# Patient Record
Sex: Female | Born: 1935 | Race: Black or African American | Hispanic: No | Smoking: Never smoker
Health system: Southern US, Community
[De-identification: ages and names within clinical notes are randomized; demographics above are authoritative.]

## PROBLEM LIST (undated history)

## (undated) DIAGNOSIS — I10 Essential (primary) hypertension: Secondary | ICD-10-CM

## (undated) DIAGNOSIS — N289 Disorder of kidney and ureter, unspecified: Secondary | ICD-10-CM

## (undated) DIAGNOSIS — F0390 Unspecified dementia without behavioral disturbance: Secondary | ICD-10-CM

## (undated) DIAGNOSIS — W19XXXA Unspecified fall, initial encounter: Secondary | ICD-10-CM

## (undated) DIAGNOSIS — E119 Type 2 diabetes mellitus without complications: Secondary | ICD-10-CM

---

## 2016-01-20 ENCOUNTER — Emergency Department (HOSPITAL_COMMUNITY): Payer: Medicare Other

## 2016-01-20 ENCOUNTER — Emergency Department (HOSPITAL_COMMUNITY)
Admission: EM | Admit: 2016-01-20 | Discharge: 2016-01-20 | Disposition: A | Discharge status: Home / Self Care | Payer: Medicare Other | Attending: Emergency Medicine | Admitting: Emergency Medicine

## 2016-01-20 ENCOUNTER — Encounter (HOSPITAL_COMMUNITY): Payer: Self-pay | Admitting: Emergency Medicine

## 2016-01-20 DIAGNOSIS — Y92129 Unspecified place in nursing home as the place of occurrence of the external cause: Secondary | ICD-10-CM | POA: Diagnosis not present

## 2016-01-20 DIAGNOSIS — Y999 Unspecified external cause status: Secondary | ICD-10-CM | POA: Insufficient documentation

## 2016-01-20 DIAGNOSIS — W1830XA Fall on same level, unspecified, initial encounter: Secondary | ICD-10-CM | POA: Diagnosis not present

## 2016-01-20 DIAGNOSIS — F0390 Unspecified dementia without behavioral disturbance: Secondary | ICD-10-CM | POA: Insufficient documentation

## 2016-01-20 DIAGNOSIS — W19XXXA Unspecified fall, initial encounter: Secondary | ICD-10-CM

## 2016-01-20 DIAGNOSIS — Y939 Activity, unspecified: Secondary | ICD-10-CM | POA: Diagnosis not present

## 2016-01-20 NOTE — ED Provider Notes (Signed)
[facility]-EMERGENCY DEPT Provider Note   CSN: [id number] Arrival date & time: 01/20/16  1427     History   Chief Complaint Chief Complaint  Patient presents with  . Fall    HPI Cathy Jennings is a 82 y.o. female.  HPI Patient presents to the emergency department with a fall from the nursing home.  The patient was in the activities room and the staff found her sitting at the base of her wheelchair staff did not witness the fall.  The patient was conscious and in her normal state when they found her.  The patient does have dementia and is unable to give much of a history about the fall No past medical history on file.  There are no active problems to display for this patient.   No past surgical history on file.  OB History    No data available       Home Medications    Prior to Admission medications   Medication Sig Start Date End Date Taking? Authorizing Provider  acetaminophen (TYLENOL) 325 MG tablet Take 650 mg by mouth 3 (three) times daily.   Yes Historical Provider, MD  alendronate (FOSAMAX) 70 MG tablet Take 70 mg by mouth once a week. Take with a full glass of water on an empty stomach.   Yes Historical Provider, MD  atorvastatin (LIPITOR) 10 MG tablet Take 10 mg by mouth at bedtime.   Yes Historical Provider, MD  Calcium Carbonate-Vitamin D3 (CALCIUM 600+D3) 600-400 MG-UNIT TABS Take 1 tablet by mouth 2 (two) times daily.   Yes Historical Provider, MD  diclofenac sodium (VOLTAREN) 1 % GEL Apply 4 g topically every morning. APPLY TO BOTH KNEES   Yes Historical Provider, MD  divalproex (DEPAKOTE SPRINKLE) 125 MG capsule Take 250 mg by mouth every 8 (eight) hours.   Yes Historical Provider, MD  donepezil (ARICEPT) 10 MG tablet Take 10 mg by mouth at bedtime.   Yes Historical Provider, MD  lactulose, encephalopathy, (GENERLAC) 10 GM/15ML SOLN Take 20 g by mouth daily as needed (CONSTIPATION).   Yes Historical Provider, MD  levothyroxine (SYNTHROID, LEVOTHROID) 88 MCG tablet  Take 88 mcg by mouth daily before breakfast.   Yes Historical Provider, MD  Melatonin 1 MG TABS Take 2 mg by mouth at bedtime.   Yes Historical Provider, MD  OLANZapine (ZYPREXA) 5 MG tablet Take 5 mg by mouth at bedtime.   Yes Historical Provider, MD  senna-docusate (SENOKOT-S) 8.6-50 MG tablet Take 2 tablets by mouth 2 (two) times daily.   Yes Historical Provider, MD  traZODone (DESYREL) 50 MG tablet Take 25 mg by mouth at bedtime.   Yes Historical Provider, MD  vitamin C (ASCORBIC ACID) 500 MG tablet Take 500 mg by mouth every morning.   Yes Historical Provider, MD    Family History No family history on file.  Social History Social History  Substance Use Topics  . Smoking status: Never Smoker  . Smokeless tobacco: Never Used  . Alcohol use Not on file     Allergies   Review of patient's allergies indicates no known allergies.   Review of Systems Review of Systems Level V caveat applies due to dementia  Physical Exam Updated Vital Signs BP 144/59   Pulse (!) 56   Temp 98.7 F (37.1 C)   Resp 13   SpO2 96%   Physical Exam  Constitutional: She is oriented to person, place, and time. She appears well-developed and well-nourished. No distress.  HENT:  Head: Normocephalic  and atraumatic.  Mouth/Throat: Oropharynx is clear and moist.  Eyes: Pupils are equal, round, and reactive to light.  Neck: Normal range of motion. Neck supple.  Cardiovascular: Normal rate, regular rhythm and normal heart sounds.  Exam reveals no gallop and no friction rub.   No murmur heard. Pulmonary/Chest: Effort normal and breath sounds normal. No respiratory distress. She has no wheezes.  Abdominal: Soft. Bowel sounds are normal. She exhibits no distension. There is no tenderness.  Musculoskeletal:       Right hip: Normal.       Left hip: Normal.       Cervical back: Normal.       Thoracic back: Normal.       Lumbar back: Normal.  Neurological: She is alert and oriented to person, place, and  time. She exhibits normal muscle tone. Coordination normal.  Skin: Skin is warm and dry. No rash noted. No erythema.  Psychiatric: She has a normal mood and affect. Her behavior is normal.  Nursing note and vitals reviewed.    ED Treatments / Results  Labs (all labs ordered are listed, but only abnormal results are displayed) Labs Reviewed - No data to display  EKG  EKG Interpretation None       Radiology Dg Lumbar Spine Complete  Result Date: 01/20/2016 CLINICAL DATA:  Unwitnessed fall. EXAM: LUMBAR SPINE - COMPLETE 4+ VIEW COMPARISON:  None. FINDINGS: There is no evidence of lumbar spine fracture. There are extensive osteoarthritic changes throughout the lumbosacral spine, particularly worse at L3-L4, L4-L5 and L5-S1, with disc space narrowing, endplate sclerosis, remodeling of vertebral bodies and osteophyte formation. Posterior facet arthropathy, moderate in severity is also seen in the lower lumbosacral spine. No alignment abnormalities are seen. IMPRESSION: No acute fracture or dislocation identified about the lumbosacral spine. Moderate to advanced osteoarthritic changes, particularly prominent in the lower lumbosacral spine. Electronically Signed   By: Ted Mcalpine M.D.   On: 01/20/2016 16:36   Ct Head Wo Contrast  Result Date: 01/20/2016 CLINICAL DATA:  Unwitnessed fall today at nursing home. EXAM: CT HEAD WITHOUT CONTRAST TECHNIQUE: Contiguous axial images were obtained from the base of the skull through the vertex without intravenous contrast. COMPARISON:  11/22/2014 FINDINGS: Brain: Stable age related cerebral atrophy, ventriculomegaly and periventricular white matter disease. No extra-axial fluid collections are identified. No CT findings for acute hemispheric infarction or intracranial hemorrhage. No mass lesions. The brainstem and cerebellum are normal. Vascular: Stable vascular calcifications. No definite aneurysm hyperdense vessels. Skull: No skull fracture or bone  lesion. Stable dural calcifications. Sinuses/Orbits: The paranasal sinuses and mastoid air cells are clear. The globes are intact. Other: No scalp lesions or hematoma. IMPRESSION: Stable age related cerebral atrophy, ventriculomegaly and periventricular white matter disease. No acute intracranial findings or skull fracture. Electronically Signed   By: Rudie Meyer M.D.   On: 01/20/2016 16:57   Dg Hip Unilat With Pelvis 2-3 Views Left  Result Date: 01/20/2016 CLINICAL DATA:  Fall. EXAM: DG HIP (WITH OR WITHOUT PELVIS) 2-3V LEFT COMPARISON:  No recent prior. FINDINGS: Degenerative changes lumbar spine, both SI joints, and both hips. No evidence of fracture or dislocation. Metallic densities are noted over the pelvis. Pelvic calcifications consistent phleboliths. IMPRESSION: No acute abnormality. Degenerative changes lumbar spine, both SI joints, and both hips. Electronically Signed   By: Maisie Fus  Register   On: 01/20/2016 16:35    Procedures Procedures (including critical care time)  Medications Ordered in ED Medications - No data to display  Initial Impression / Assessment and Plan / ED Course  I have reviewed the triage vital signs and the nursing notes.  Pertinent labs & imaging results that were available during my care of the patient were reviewed by me and considered in my medical decision making (see chart for details).  Clinical Course    Patient has no CT scan or lane film abnormalities.  The patient will be advised to follow-up with her primary care Dr. told to return here as needed  Final Clinical Impressions(s) / ED Diagnoses   Final diagnoses:  Fall    New Prescriptions New Prescriptions   No medications on file     Charlestine Night, PA-C 01/20/16 1759    Pricilla Loveless, MD 01/22/16 1001

## 2016-01-20 NOTE — ED Notes (Signed)
Attempted to call report to [facility]  

## 2016-01-20 NOTE — ED Triage Notes (Signed)
Per EMS, [facility], pt had unwitnessed fall in the activity room. Staff suspects patient slid out of her wheelchair. Pt is wheelchair bound with hx dementia. A&Ox1. Pt c/o left knee pain. Hx chronic knee pain

## 2016-01-20 NOTE — ED Notes (Signed)
Bed: [facility]ALE Expected date:  Expected time:  Means of arrival:  Comments: 

## 2016-01-20 NOTE — Discharge Instructions (Signed)
Follow-up with her primary care doctor.  Return here as needed °

## 2016-01-20 NOTE — ED Notes (Signed)
Bed: WA21 Expected date:  Expected time:  Means of arrival:  Comments: EMS-82yo F, fall

## 2016-01-20 NOTE — Progress Notes (Signed)
[facility].  Patient listed as having Medicaid insurance without a pcp.  Per nursing facility transfer paperwork, patient's pcp is Dr. Ron ParkerSamuel Bowen.  System updated.

## 2016-01-20 NOTE — ED Notes (Signed)
[facility] called for transport.  

## 2016-02-26 ENCOUNTER — Emergency Department (HOSPITAL_COMMUNITY): Payer: Medicare Other

## 2016-02-26 ENCOUNTER — Emergency Department (HOSPITAL_COMMUNITY)
Admission: EM | Admit: 2016-02-26 | Discharge: 2016-02-26 | Disposition: A | Discharge status: Home / Self Care | Payer: Medicare Other | Attending: Emergency Medicine | Admitting: Emergency Medicine

## 2016-02-26 ENCOUNTER — Encounter (HOSPITAL_COMMUNITY): Payer: Self-pay | Admitting: Emergency Medicine

## 2016-02-26 DIAGNOSIS — Y999 Unspecified external cause status: Secondary | ICD-10-CM | POA: Diagnosis not present

## 2016-02-26 DIAGNOSIS — Y929 Unspecified place or not applicable: Secondary | ICD-10-CM | POA: Diagnosis not present

## 2016-02-26 DIAGNOSIS — E119 Type 2 diabetes mellitus without complications: Secondary | ICD-10-CM | POA: Insufficient documentation

## 2016-02-26 DIAGNOSIS — Y939 Activity, unspecified: Secondary | ICD-10-CM | POA: Insufficient documentation

## 2016-02-26 DIAGNOSIS — I10 Essential (primary) hypertension: Secondary | ICD-10-CM | POA: Diagnosis not present

## 2016-02-26 DIAGNOSIS — S0990XA Unspecified injury of head, initial encounter: Secondary | ICD-10-CM | POA: Insufficient documentation

## 2016-02-26 DIAGNOSIS — F0390 Unspecified dementia without behavioral disturbance: Secondary | ICD-10-CM | POA: Diagnosis not present

## 2016-02-26 DIAGNOSIS — W19XXXA Unspecified fall, initial encounter: Secondary | ICD-10-CM

## 2016-02-26 HISTORY — DX: Disorder of kidney and ureter, unspecified: N28.9

## 2016-02-26 HISTORY — DX: Unspecified fall, initial encounter: W19.XXXA

## 2016-02-26 HISTORY — DX: Essential (primary) hypertension: I10

## 2016-02-26 HISTORY — DX: Unspecified dementia, unspecified severity, without behavioral disturbance, psychotic disturbance, mood disturbance, and anxiety: F03.90

## 2016-02-26 HISTORY — DX: Type 2 diabetes mellitus without complications: E11.9

## 2016-02-26 LAB — CBC WITH DIFFERENTIAL/PLATELET
Basophils Absolute: 0 10*3/uL (ref 0.0–0.1)
Basophils Relative: 0 %
EOS ABS: 0.2 10*3/uL (ref 0.0–0.7)
Eosinophils Relative: 2 %
HEMATOCRIT: 34.3 % — AB (ref 36.0–46.0)
HEMOGLOBIN: 11.1 g/dL — AB (ref 12.0–15.0)
Lymphocytes Relative: 25 %
Lymphs Abs: 2.3 10*3/uL (ref 0.7–4.0)
MCH: 30.5 pg (ref 26.0–34.0)
MCHC: 32.4 g/dL (ref 30.0–36.0)
MCV: 94.2 fL (ref 78.0–100.0)
MONO ABS: 0.9 10*3/uL (ref 0.1–1.0)
Monocytes Relative: 10 %
Neutro Abs: 5.6 10*3/uL (ref 1.7–7.7)
Neutrophils Relative %: 63 %
Platelets: 175 10*3/uL (ref 150–400)
RBC: 3.64 MIL/uL — AB (ref 3.87–5.11)
RDW: 17 % — ABNORMAL HIGH (ref 11.5–15.5)
WBC: 9 10*3/uL (ref 4.0–10.5)

## 2016-02-26 LAB — COMPREHENSIVE METABOLIC PANEL
ALBUMIN: 3.5 g/dL (ref 3.5–5.0)
ALK PHOS: 58 U/L (ref 38–126)
ALT: 17 U/L (ref 14–54)
AST: 32 U/L (ref 15–41)
Anion gap: 7 (ref 5–15)
BILIRUBIN TOTAL: 0.6 mg/dL (ref 0.3–1.2)
BUN: 9 mg/dL (ref 6–20)
CALCIUM: 9.2 mg/dL (ref 8.9–10.3)
CO2: 28 mmol/L (ref 22–32)
CREATININE: 0.78 mg/dL (ref 0.44–1.00)
Chloride: 105 mmol/L (ref 101–111)
GFR calc Af Amer: >60 mL/min (ref >60)
GFR calc non Af Amer: >60 mL/min (ref >60)
GLUCOSE: 85 mg/dL (ref 65–99)
Potassium: 4 mmol/L (ref 3.5–5.1)
Sodium: 140 mmol/L (ref 135–145)
TOTAL PROTEIN: 6.6 g/dL (ref 6.5–8.1)

## 2016-02-26 LAB — URINALYSIS, ROUTINE W REFLEX MICROSCOPIC
Bilirubin Urine: NEGATIVE
GLUCOSE, UA: NEGATIVE mg/dL
HGB URINE DIPSTICK: NEGATIVE
Ketones, ur: NEGATIVE mg/dL
LEUKOCYTES UA: NEGATIVE
Nitrite: NEGATIVE
PROTEIN: NEGATIVE mg/dL
SPECIFIC GRAVITY, URINE: 1.008 (ref 1.005–1.030)
pH: 7.5 (ref 5.0–8.0)

## 2016-02-26 LAB — TROPONIN I

## 2016-02-26 LAB — CBG MONITORING, ED: GLUCOSE-CAPILLARY: 78 mg/dL (ref 65–99)

## 2016-02-26 MED ORDER — SODIUM CHLORIDE 0.9 % IV BOLUS (SEPSIS)
1000.0000 mL | Freq: Once | INTRAVENOUS | Status: AC
  Administered 2016-02-26: 1000 mL via INTRAVENOUS

## 2016-02-26 NOTE — ED Notes (Signed)
Bed: WA03 Expected date:  Expected time:  Means of arrival:  Comments: 82 yo F fall, hypotension

## 2016-02-26 NOTE — Progress Notes (Signed)
CSW attempted to speak with patient at bedside with no one present. Patient was asleep and did not wake up to the call of her name.  Cathy Jennings, LCSWA Clincial Social Worker [phone] 3:14 PM

## 2016-02-26 NOTE — ED Provider Notes (Signed)
[facility]-EMERGENCY DEPT Provider Note   CSN: [id number] Arrival date & time: 02/26/16  1341     History   Chief Complaint Chief Complaint  Patient presents with  . Fall    HPI Cathy Jennings is a 82 y.o. female.  Patient is 82 yo F with PMH of dementia, presenting from [place] after witnessed fall today just PTA. Patient is nonverbal at baseline and normally in wheelchair. Upon speaking directly with staff member at [facility], the patient slumped forward at her table while in the dining area, fell out of her wheelchair and hit her head. No reported LOC or change in baseline mental status. Per policy, she was sent to ED for evaluation. Level V caveat applies secondary to dementia.      Past Medical History:  Diagnosis Date  . Dementia   . Diabetes mellitus without complication (HCC)   . Fall   . Hypertension   . Renal disorder     There are no active problems to display for this patient.   No past surgical history on file.  OB History    No data available       Home Medications    Prior to Admission medications   Medication Sig Start Date End Date Taking? Authorizing Provider  acetaminophen (TYLENOL) 325 MG tablet Take 650 mg by mouth 3 (three) times daily.   Yes Historical Provider, MD  acetaminophen (TYLENOL) 500 MG tablet Take 500 mg by mouth every 4 (four) hours as needed for mild pain, moderate pain, fever or headache.   Yes Historical Provider, MD  alendronate (FOSAMAX) 70 MG tablet Take 70 mg by mouth every Wednesday. Take with a full glass of water on an empty stomach.    Yes Historical Provider, MD  alum & mag hydroxide-simeth (MINTOX) 200-200-20 MG/5ML suspension Take 30 mLs by mouth as needed for indigestion or heartburn.   Yes Historical Provider, MD  atorvastatin (LIPITOR) 10 MG tablet Take 10 mg by mouth at bedtime.   Yes Historical Provider, MD  Calcium Carbonate-Vitamin D3 (CALCIUM 600+D3) 600-400 MG-UNIT TABS Take 1 tablet by mouth 2 (two)  times daily.   Yes Historical Provider, MD  diclofenac sodium (VOLTAREN) 1 % GEL Apply 4 g topically daily after breakfast. APPLY TO BOTH KNEES    Yes Historical Provider, MD  divalproex (DEPAKOTE SPRINKLE) 125 MG capsule Take 250 mg by mouth every 8 (eight) hours.   Yes Historical Provider, MD  gabapentin (NEURONTIN) 100 MG capsule Take 200 mg by mouth 3 (three) times daily.   Yes Historical Provider, MD  guaifenesin (ROBAFEN) 100 MG/5ML syrup Take 200 mg by mouth every 6 (six) hours as needed for cough.   Yes Historical Provider, MD  lactulose, encephalopathy, (GENERLAC) 10 GM/15ML SOLN Take 20 g by mouth daily as needed (CONSTIPATION).   Yes Historical Provider, MD  levothyroxine (SYNTHROID, LEVOTHROID) 88 MCG tablet Take 88 mcg by mouth daily before breakfast.   Yes Historical Provider, MD  loperamide (IMODIUM) 2 MG capsule Take 2 mg by mouth as needed for diarrhea or loose stools.   Yes Historical Provider, MD  LORazepam (ATIVAN) 0.5 MG tablet Take 0.5 mg by mouth 2 (two) times daily.   Yes Historical Provider, MD  magnesium hydroxide (MILK OF MAGNESIA) 400 MG/5ML suspension Take 30 mLs by mouth at bedtime as needed for mild constipation.   Yes Historical Provider, MD  Melatonin 1 MG TABS Take 4 mg by mouth at bedtime.    Yes Historical Provider, MD  neomycin-bacitracin-polymyxin (NEOSPORIN) ointment Apply 1 application topically as needed for wound care.   Yes Historical Provider, MD  OLANZapine (ZYPREXA) 2.5 MG tablet Take 2.5 mg by mouth 2 (two) times daily.   Yes Historical Provider, MD  PRESCRIPTION MEDICATION Apply 0.5 mLs topically 3 (three) times daily as needed (for severe agitation). *R-Lorazepam Gel 1mg /ml*  Apply to skin   Yes Historical Provider, MD  senna-docusate (SENOKOT-S) 8.6-50 MG tablet Take 2 tablets by mouth 2 (two) times daily.   Yes Historical Provider, MD  traZODone (DESYREL) 50 MG tablet Take 50 mg by mouth at bedtime.    Yes Historical Provider, MD  traZODone  (DESYREL) 50 MG tablet Take 50 mg by mouth See admin instructions. Take 50mg  now then 50mg  every 6 hours as needed   Yes Historical Provider, MD  vitamin C (ASCORBIC ACID) 500 MG tablet Take 500 mg by mouth every morning.   Yes Historical Provider, MD    Family History History reviewed. No pertinent family history.  Social History Social History  Substance Use Topics  . Smoking status: Never Smoker  . Smokeless tobacco: Never Used  . Alcohol use No     Allergies   Patient has no known allergies.   Review of Systems Review of Systems  Unable to perform ROS: Dementia     Physical Exam Updated Vital Signs BP 130/59 (BP Location: Left Arm)   Pulse (!) 54   Temp 98.5 F (36.9 C) (Oral)   Resp 18   SpO2 97%   Physical Exam  Constitutional:  Elderly female, nonverbal and snoring in bed, VSS  HENT:  Head: Normocephalic and atraumatic.  Mouth/Throat: Oropharynx is clear and moist.  No Raccoon's eyes, Battle's sign, abrasion, or contusion noted to head. No hemotympanum, external ears normal bilaterally. No nasal deformity.  Eyes: Conjunctivae are normal.  Pinpoint pupils noted.  Neck: Normal range of motion. Neck supple.  Cardiovascular: Normal rate, regular rhythm, normal heart sounds and intact distal pulses.   Pulmonary/Chest: Effort normal and breath sounds normal. No respiratory distress. She has no wheezes. She has no rales.  Abdominal: Soft. Bowel sounds are normal. She exhibits no distension.  Musculoskeletal: Normal range of motion. She exhibits no edema or tenderness.  Bilateral hips with FROM, BLE no crepitus or deformity, no limb length discrepancy or abnormal rotation. Distal pulses intact, compartments soft.  Neurological:  Patient responds to painful stimuli, but nonverbal at baseline. Limited neuro exam performed secondary to advanced dementia. Patient at baseline according to staff at [facility].  Skin: Skin is warm and dry.  Nursing note and vitals  reviewed.    ED Treatments / Results  Labs (all labs ordered are listed, but only abnormal results are displayed) Labs Reviewed  CBC WITH DIFFERENTIAL/PLATELET - Abnormal; Notable for the following:       Result Value   RBC 3.64 (*)    Hemoglobin 11.1 (*)    HCT 34.3 (*)    RDW 17.0 (*)    All other components within normal limits  COMPREHENSIVE METABOLIC PANEL  URINALYSIS, ROUTINE W REFLEX MICROSCOPIC (NOT AT [facility])  TROPONIN I  CBG MONITORING, ED    EKG  EKG Interpretation None       Radiology Dg Chest 2 View  Result Date: 02/26/2016 CLINICAL DATA:  Recent fall EXAM: CHEST  2 VIEW COMPARISON:  None. FINDINGS: Cardiac shadow is at the upper limits of normal in size. The lungs are clear bilaterally. No acute bony abnormality is seen. Degenerative  changes of the shoulder joints is noted right greater than left. IMPRESSION: No acute abnormality noted. Electronically Signed   By: Alcide CleverMark  Lukens M.D.   On: 02/26/2016 16:08   Ct Head Wo Contrast  Result Date: 02/26/2016 CLINICAL DATA:  Larey SeatFell out of wheelchair today EXAM: CT HEAD WITHOUT CONTRAST CT CERVICAL SPINE WITHOUT CONTRAST TECHNIQUE: Multidetector CT imaging of the head and cervical spine was performed following the standard protocol without intravenous contrast. Multiplanar CT image reconstructions of the cervical spine were also generated. COMPARISON:  01/20/2016 FINDINGS: CT HEAD FINDINGS Brain: No intracranial hemorrhage, mass effect or midline shift. There are motion artifacts. Stable atrophy and chronic white matter disease. No definite acute cortical infarction. No mass lesion is noted on this unenhanced scan. Ventricular size is stable from prior exam. Vascular: Atherosclerotic calcifications of carotid siphon Skull: No skull fracture is identified. Sinuses/Orbits: No acute finding. Other: None. CT CERVICAL SPINE FINDINGS Alignment: There is normal alignment. Skull base and vertebrae: No acute fracture or subluxation.  Degenerative changes are noted C1-C2 articulation. Multilevel moderate anterior spurring is noted. Multilevel mild posterior spurring. Soft tissues and spinal canal: No prevertebral soft tissue swelling. Mild spinal canal stenosis due to posterior spurring at C5-C6-C6-C7 C7-T1 and T1-T2 level. Disc levels: There is disc space flattening at C3-C4 level. Mild disc space flattening at C4-C5 level. Significant disc space flattening with endplate sclerotic changes at C5-C6-C6-C7 and C7-T1 level. Moderate disc space flattening at T1-T2 level. Upper chest: No pneumothorax in visualized lung apices. Atherosclerotic calcifications are noted bilateral carotid bifurcation. Other: None IMPRESSION: 1. No acute intracranial abnormality. Stable atrophy and chronic white matter disease. 2. No cervical spine acute fracture or subluxation. Multilevel degenerative changes as described above. Electronically Signed   By: Natasha MeadLiviu  Pop M.D.   On: 02/26/2016 16:03   Ct Cervical Spine Wo Contrast  Result Date: 02/26/2016 CLINICAL DATA:  Larey SeatFell out of wheelchair today EXAM: CT HEAD WITHOUT CONTRAST CT CERVICAL SPINE WITHOUT CONTRAST TECHNIQUE: Multidetector CT imaging of the head and cervical spine was performed following the standard protocol without intravenous contrast. Multiplanar CT image reconstructions of the cervical spine were also generated. COMPARISON:  01/20/2016 FINDINGS: CT HEAD FINDINGS Brain: No intracranial hemorrhage, mass effect or midline shift. There are motion artifacts. Stable atrophy and chronic white matter disease. No definite acute cortical infarction. No mass lesion is noted on this unenhanced scan. Ventricular size is stable from prior exam. Vascular: Atherosclerotic calcifications of carotid siphon Skull: No skull fracture is identified. Sinuses/Orbits: No acute finding. Other: None. CT CERVICAL SPINE FINDINGS Alignment: There is normal alignment. Skull base and vertebrae: No acute fracture or subluxation.  Degenerative changes are noted C1-C2 articulation. Multilevel moderate anterior spurring is noted. Multilevel mild posterior spurring. Soft tissues and spinal canal: No prevertebral soft tissue swelling. Mild spinal canal stenosis due to posterior spurring at C5-C6-C6-C7 C7-T1 and T1-T2 level. Disc levels: There is disc space flattening at C3-C4 level. Mild disc space flattening at C4-C5 level. Significant disc space flattening with endplate sclerotic changes at C5-C6-C6-C7 and C7-T1 level. Moderate disc space flattening at T1-T2 level. Upper chest: No pneumothorax in visualized lung apices. Atherosclerotic calcifications are noted bilateral carotid bifurcation. Other: None IMPRESSION: 1. No acute intracranial abnormality. Stable atrophy and chronic white matter disease. 2. No cervical spine acute fracture or subluxation. Multilevel degenerative changes as described above. Electronically Signed   By: Natasha MeadLiviu  Pop M.D.   On: 02/26/2016 16:03    Procedures Procedures (including critical care time)  Medications Ordered in  ED Medications  sodium chloride 0.9 % bolus 1,000 mL (1,000 mLs Intravenous New Bag/Given 02/26/16 1622)     Initial Impression / Assessment and Plan / ED Course  I have reviewed the triage vital signs and the nursing notes.  Pertinent labs & imaging results that were available during my care of the patient were reviewed by me and considered in my medical decision making (see chart for details).  Clinical Course    Patient is 82 yo F with PMH of dementia, [place] [facility] [place] [facility] [place] just [place]. On exam, VSS with no evidence of trauma, but patient is nonverbal and therefore neuro exam limited. She responds to painful stimuli, and according to staff is at baseline mental status. CT head and neck show no acute intracranial abnormality or fractures. EKG with no significant changes from prior tracing, and troponin negative. Per discussion with attending Theda Belfasthris Tegeler,  CXR and labs ordered to r/o acute infection. CXR negative. CBC, CMP, and urinalysis unremarkable. Given no acute findings, patient stable for d/c back to facility with return precautions for any new or worsening symptoms or change in mental status.  Final Clinical Impressions(s) / ED Diagnoses   Final diagnoses:  Fall, initial encounter    New Prescriptions New Prescriptions   No medications on file     Jari PiggDaryl F de Villier II, [place] 02/26/16 1800    Heide Scaleshristopher J Tegeler, MD 02/28/16 [phone]

## 2016-02-26 NOTE — Discharge Instructions (Signed)
Cathy Jennings has no evidence of trauma from her fall, and had a negative head and neck CT. There is no acute cardiac cause, or evidence of pneumonia, anemia, electrolyte imbalance, or UTI causing her to fall. Please return to ED for any change from baseline mental status.

## 2016-02-26 NOTE — Progress Notes (Signed)
Pt with [facility] 2 ED visits in last 6 months and both times for falls  No ED CP  No [facility] referral availability  ED CM and ED SW spoke about this pt -info sent to SW asst director

## 2016-02-26 NOTE — ED Triage Notes (Signed)
[facility] of [facility] today. Nonverbal, normally in wheelchair. Larey SeatFell forward out of wheelchair today while in the dining area.

## 2016-03-26 ENCOUNTER — Encounter (HOSPITAL_COMMUNITY): Payer: Self-pay | Admitting: *Deleted

## 2016-03-26 ENCOUNTER — Inpatient Hospital Stay (HOSPITAL_COMMUNITY)
Admission: EM | Admit: 2016-03-26 | Discharge: 2016-03-30 | DRG: 871 | Disposition: A | Discharge status: Hospice | Payer: Medicare Other | Attending: Internal Medicine | Admitting: Internal Medicine

## 2016-03-26 ENCOUNTER — Emergency Department (HOSPITAL_COMMUNITY): Payer: Medicare Other

## 2016-03-26 DIAGNOSIS — E039 Hypothyroidism, unspecified: Secondary | ICD-10-CM | POA: Diagnosis present

## 2016-03-26 DIAGNOSIS — Z66 Do not resuscitate: Secondary | ICD-10-CM | POA: Diagnosis present

## 2016-03-26 DIAGNOSIS — N179 Acute kidney failure, unspecified: Secondary | ICD-10-CM | POA: Diagnosis present

## 2016-03-26 DIAGNOSIS — E119 Type 2 diabetes mellitus without complications: Secondary | ICD-10-CM | POA: Diagnosis not present

## 2016-03-26 DIAGNOSIS — G9341 Metabolic encephalopathy: Secondary | ICD-10-CM | POA: Diagnosis present

## 2016-03-26 DIAGNOSIS — E860 Dehydration: Secondary | ICD-10-CM | POA: Diagnosis present

## 2016-03-26 DIAGNOSIS — F0390 Unspecified dementia without behavioral disturbance: Secondary | ICD-10-CM | POA: Diagnosis present

## 2016-03-26 DIAGNOSIS — N3001 Acute cystitis with hematuria: Secondary | ICD-10-CM | POA: Diagnosis not present

## 2016-03-26 DIAGNOSIS — E870 Hyperosmolality and hypernatremia: Secondary | ICD-10-CM | POA: Diagnosis present

## 2016-03-26 DIAGNOSIS — I4891 Unspecified atrial fibrillation: Secondary | ICD-10-CM | POA: Diagnosis present

## 2016-03-26 DIAGNOSIS — N390 Urinary tract infection, site not specified: Secondary | ICD-10-CM | POA: Diagnosis present

## 2016-03-26 DIAGNOSIS — Z515 Encounter for palliative care: Secondary | ICD-10-CM | POA: Diagnosis present

## 2016-03-26 DIAGNOSIS — B370 Candidal stomatitis: Secondary | ICD-10-CM | POA: Diagnosis present

## 2016-03-26 DIAGNOSIS — E1165 Type 2 diabetes mellitus with hyperglycemia: Secondary | ICD-10-CM | POA: Diagnosis present

## 2016-03-26 DIAGNOSIS — R4182 Altered mental status, unspecified: Secondary | ICD-10-CM | POA: Diagnosis not present

## 2016-03-26 DIAGNOSIS — I10 Essential (primary) hypertension: Secondary | ICD-10-CM | POA: Diagnosis present

## 2016-03-26 DIAGNOSIS — J9601 Acute respiratory failure with hypoxia: Secondary | ICD-10-CM | POA: Diagnosis present

## 2016-03-26 DIAGNOSIS — A419 Sepsis, unspecified organism: Principal | ICD-10-CM | POA: Diagnosis present

## 2016-03-26 DIAGNOSIS — Z7983 Long term (current) use of bisphosphonates: Secondary | ICD-10-CM | POA: Diagnosis not present

## 2016-03-26 LAB — COMPREHENSIVE METABOLIC PANEL
ALBUMIN: 3.2 g/dL — AB (ref 3.5–5.0)
ALT: 39 U/L (ref 14–54)
AST: 55 U/L — AB (ref 15–41)
Alkaline Phosphatase: 66 U/L (ref 38–126)
BILIRUBIN TOTAL: 0.5 mg/dL (ref 0.3–1.2)
BUN: 96 mg/dL — AB (ref 6–20)
CO2: 26 mmol/L (ref 22–32)
CREATININE: 1.69 mg/dL — AB (ref 0.44–1.00)
Calcium: 9.4 mg/dL (ref 8.9–10.3)
GFR calc Af Amer: 29 mL/min — ABNORMAL LOW (ref >60)
GFR, EST NON AFRICAN AMERICAN: 25 mL/min — AB (ref >60)
GLUCOSE: 347 mg/dL — AB (ref 65–99)
POTASSIUM: 3.8 mmol/L (ref 3.5–5.1)
Sodium: 168 mmol/L (ref 135–145)
TOTAL PROTEIN: 6.9 g/dL (ref 6.5–8.1)

## 2016-03-26 LAB — URINALYSIS, ROUTINE W REFLEX MICROSCOPIC
Glucose, UA: NEGATIVE mg/dL
Ketones, ur: NEGATIVE mg/dL
NITRITE: POSITIVE — AB
PROTEIN: 100 mg/dL — AB
pH: 5 (ref 5.0–8.0)

## 2016-03-26 LAB — URINALYSIS, MICROSCOPIC (REFLEX)

## 2016-03-26 LAB — GLUCOSE, CAPILLARY: GLUCOSE-CAPILLARY: 265 mg/dL — AB (ref 65–99)

## 2016-03-26 LAB — I-STAT TROPONIN, ED: Troponin i, poc: 0.01 ng/mL (ref 0.00–0.08)

## 2016-03-26 LAB — PROTIME-INR
INR: 1.09
PROTHROMBIN TIME: 14.2 s (ref 11.4–15.2)

## 2016-03-26 LAB — CBC
HEMATOCRIT: 44.1 % (ref 36.0–46.0)
Hemoglobin: 13.1 g/dL (ref 12.0–15.0)
MCH: 29.8 pg (ref 26.0–34.0)
MCHC: 29.7 g/dL — AB (ref 30.0–36.0)
MCV: 100.5 fL — AB (ref 78.0–100.0)
PLATELETS: 149 10*3/uL — AB (ref 150–400)
RBC: 4.39 MIL/uL (ref 3.87–5.11)
RDW: 18.5 % — AB (ref 11.5–15.5)
WBC: 9.6 10*3/uL (ref 4.0–10.5)

## 2016-03-26 LAB — I-STAT CG4 LACTIC ACID, ED
LACTIC ACID, VENOUS: 4.09 mmol/L — AB (ref 0.5–1.9)
Lactic Acid, Venous: 5.42 mmol/L (ref 0.5–1.9)

## 2016-03-26 LAB — LIPASE, BLOOD: LIPASE: 58 U/L — AB (ref 11–51)

## 2016-03-26 LAB — AMMONIA: AMMONIA: 25 umol/L (ref 9–35)

## 2016-03-26 LAB — CBG MONITORING, ED: Glucose-Capillary: 317 mg/dL — ABNORMAL HIGH (ref 65–99)

## 2016-03-26 MED ORDER — VANCOMYCIN HCL 500 MG IV SOLR: 500.0000 mg | INTRAVENOUS | Status: DC

## 2016-03-26 MED ORDER — SODIUM CHLORIDE 0.9% FLUSH
3.0000 mL | Freq: Two times a day (BID) | INTRAVENOUS | Status: DC
  Administered 2016-03-26 – 2016-03-30 (×6): 3 mL via INTRAVENOUS

## 2016-03-26 MED ORDER — INSULIN ASPART 100 UNIT/ML ~~LOC~~ SOLN
0.0000 [IU] | SUBCUTANEOUS | Status: DC
  Administered 2016-03-26: 5 [IU] via SUBCUTANEOUS
  Administered 2016-03-27: 2 [IU] via SUBCUTANEOUS
  Administered 2016-03-27 (×2): 1 [IU] via SUBCUTANEOUS

## 2016-03-26 MED ORDER — ORAL CARE MOUTH RINSE
15.0000 mL | Freq: Two times a day (BID) | OROMUCOSAL | Status: DC
  Administered 2016-03-27 – 2016-03-30 (×5): 15 mL via OROMUCOSAL

## 2016-03-26 MED ORDER — SODIUM CHLORIDE 0.9 % IV SOLN
INTRAVENOUS | Status: DC
  Administered 2016-03-26: 75 mL via INTRAVENOUS

## 2016-03-26 MED ORDER — IPRATROPIUM BROMIDE 0.02 % IN SOLN
0.5000 mg | Freq: Four times a day (QID) | RESPIRATORY_TRACT | Status: DC
  Administered 2016-03-26 – 2016-03-27 (×2): 0.5 mg via RESPIRATORY_TRACT
  Filled 2016-03-26: qty 2.5

## 2016-03-26 MED ORDER — VANCOMYCIN HCL IN DEXTROSE 1-5 GM/200ML-% IV SOLN
1000.0000 mg | Freq: Once | INTRAVENOUS | Status: AC
  Administered 2016-03-26: 1000 mg via INTRAVENOUS
  Filled 2016-03-26: qty 200

## 2016-03-26 MED ORDER — ENOXAPARIN SODIUM 40 MG/0.4ML ~~LOC~~ SOLN
40.0000 mg | Freq: Every day | SUBCUTANEOUS | Status: DC
  Administered 2016-03-26: 40 mg via SUBCUTANEOUS
  Filled 2016-03-26: qty 0.4

## 2016-03-26 MED ORDER — PIPERACILLIN-TAZOBACTAM 3.375 G IVPB 30 MIN
3.3750 g | Freq: Once | INTRAVENOUS | Status: AC
  Administered 2016-03-26: 3.375 g via INTRAVENOUS
  Filled 2016-03-26: qty 50

## 2016-03-26 MED ORDER — FLUCONAZOLE IN SODIUM CHLORIDE 200-0.9 MG/100ML-% IV SOLN
200.0000 mg | Freq: Every day | INTRAVENOUS | Status: DC
  Administered 2016-03-27: 200 mg via INTRAVENOUS
  Filled 2016-03-26 (×2): qty 100

## 2016-03-26 MED ORDER — ALBUTEROL SULFATE (2.5 MG/3ML) 0.083% IN NEBU
2.5000 mg | INHALATION_SOLUTION | Freq: Four times a day (QID) | RESPIRATORY_TRACT | Status: DC
  Administered 2016-03-26 – 2016-03-27 (×2): 2.5 mg via RESPIRATORY_TRACT
  Filled 2016-03-26: qty 3

## 2016-03-26 MED ORDER — ONDANSETRON HCL 4 MG/2ML IJ SOLN: 4.0000 mg | Freq: Four times a day (QID) | INTRAMUSCULAR | Status: DC | PRN

## 2016-03-26 MED ORDER — ACETAMINOPHEN 650 MG RE SUPP: 650.0000 mg | Freq: Four times a day (QID) | RECTAL | Status: DC | PRN

## 2016-03-26 MED ORDER — SODIUM CHLORIDE 0.9 % IV BOLUS (SEPSIS)
500.0000 mL | Freq: Once | INTRAVENOUS | Status: AC
  Administered 2016-03-26: 500 mL via INTRAVENOUS

## 2016-03-26 MED ORDER — ACETAMINOPHEN 325 MG PO TABS: 650.0000 mg | ORAL_TABLET | Freq: Four times a day (QID) | ORAL | Status: DC | PRN

## 2016-03-26 MED ORDER — IPRATROPIUM BROMIDE 0.02 % IN SOLN
RESPIRATORY_TRACT | Status: AC
  Filled 2016-03-26: qty 2.5

## 2016-03-26 MED ORDER — SODIUM CHLORIDE 0.9 % IV BOLUS (SEPSIS)
250.0000 mL | Freq: Once | INTRAVENOUS | Status: AC
  Administered 2016-03-26: 250 mL via INTRAVENOUS

## 2016-03-26 MED ORDER — CHLORHEXIDINE GLUCONATE 0.12 % MT SOLN
15.0000 mL | Freq: Two times a day (BID) | OROMUCOSAL | Status: DC
  Administered 2016-03-26 – 2016-03-30 (×7): 15 mL via OROMUCOSAL
  Filled 2016-03-26 (×5): qty 15

## 2016-03-26 MED ORDER — PIPERACILLIN-TAZOBACTAM IN DEX 2-0.25 GM/50ML IV SOLN
2.2500 g | Freq: Three times a day (TID) | INTRAVENOUS | Status: DC
  Administered 2016-03-27: 2.25 g via INTRAVENOUS
  Filled 2016-03-26 (×2): qty 50

## 2016-03-26 MED ORDER — ALBUTEROL SULFATE (2.5 MG/3ML) 0.083% IN NEBU
INHALATION_SOLUTION | RESPIRATORY_TRACT | Status: AC
  Filled 2016-03-26: qty 3

## 2016-03-26 MED ORDER — ONDANSETRON HCL 4 MG PO TABS: 4.0000 mg | ORAL_TABLET | Freq: Four times a day (QID) | ORAL | Status: DC | PRN

## 2016-03-26 MED ORDER — SODIUM CHLORIDE 0.9 % IV BOLUS (SEPSIS)
1000.0000 mL | Freq: Once | INTRAVENOUS | Status: AC
  Administered 2016-03-26: 1000 mL via INTRAVENOUS

## 2016-03-26 MED ORDER — ALBUTEROL SULFATE (2.5 MG/3ML) 0.083% IN NEBU: 2.5000 mg | INHALATION_SOLUTION | RESPIRATORY_TRACT | Status: DC | PRN

## 2016-03-26 NOTE — ED Notes (Signed)
REsp attempted x 3 to draw ABG.

## 2016-03-26 NOTE — ED Triage Notes (Signed)
Per EMS, pt from [facility] due to altered mental status for the past 2-3 days. EMS states the patient will usually answer questions but has not been talking for the past 2-3 days. Staff state the pt has stopped taking her medications. CBG 338.

## 2016-03-26 NOTE — ED Notes (Signed)
Patient moans and attempts to verbalize when turned or with painful stimuli. Patient has an area on the right inner knee with no skin. Knee dressed with gauze. Foul smelling urine and incontinent. Patient also has clumps of yellow white patches on roof of mouth and tongue.

## 2016-03-26 NOTE — Progress Notes (Signed)
Two RT's attempted to to obtain ABG- unsuccessful (RN aware).

## 2016-03-26 NOTE — ED Notes (Signed)
Patient transported to CT 

## 2016-03-26 NOTE — ED Notes (Signed)
One set of blood cultures drawn. 

## 2016-03-26 NOTE — H&P (Addendum)
History and Physical    Cathy Jennings UUV:[id number] DOB: 1922/08/16 DOA: 03/26/2016  Referring MD/NP/PA: Dr. Thomasene Lot PCP: Sande Brothers, MD  Patient coming from: [facility]  Chief Complaint: Altered mental status.  HPI: Cathy Jennings is a 82 y.o. female with medical history significant of HTN, dementia, diabetes mellitus type 2; who presented for being acutely altered. History is obtained via report has patient is unable to provide her own history due to mental status. At baseline patient usually would answer questions, but had not been talking for the last 2-3 days per facility staff, and was noted to have stopped taking all her medications. Patient does not give a verbal response at this time to any questions asked.  ED Course: Upon admission to the emergency department patient was evaluated and seen to be afebrile, respirations 16-28, blood pressure elevated to 187/153, O2 saturations maintained on NRB face mask. Lab work revealed lactic acid half 0.42, sodium 168, BUN 96, creatinine 1.69, and urinalysis positive for infection. Chest x-ray and head CT showing no acute abnormalities. Sepsis protocol was initiated and the patient was given vancomycin and Zosyn along with 1.5 L of normal saline IV fluids..  Review of Systems: Unable to obtain secondary to patient mental status  Past Medical History:  Diagnosis Date  . Dementia   . Diabetes mellitus without complication ([place])   . Fall   . Hypertension   . Renal disorder     History reviewed. No pertinent surgical history.   reports that she has never smoked. She has never used smokeless tobacco. She reports that she does not drink alcohol. Her drug history is not on file.  No Known Allergies  No family history on file.  Prior to Admission medications   Medication Sig Start Date End Date Taking? Authorizing Provider  acetaminophen (TYLENOL) 325 MG tablet Take 650 mg by mouth 3 (three) times daily.   Yes Historical Provider, MD    acetaminophen (TYLENOL) 500 MG tablet Take 500 mg by mouth every 4 (four) hours as needed for mild pain, moderate pain, fever or headache.   Yes Historical Provider, MD  alendronate (FOSAMAX) 70 MG tablet Take 70 mg by mouth every Wednesday. Take with a full glass of water on an empty stomach.    Yes Historical Provider, MD  alum & mag hydroxide-simeth (MINTOX) 200-200-20 MG/5ML suspension Take 30 mLs by mouth as needed for indigestion or heartburn.   Yes Historical Provider, MD  atorvastatin (LIPITOR) 10 MG tablet Take 10 mg by mouth at bedtime.   Yes Historical Provider, MD  Calcium Carbonate-Vitamin D3 (CALCIUM 600+D3) 600-400 MG-UNIT TABS Take 1 tablet by mouth 2 (two) times daily.   Yes Historical Provider, MD  diclofenac sodium (VOLTAREN) 1 % GEL Apply 4 g topically daily after breakfast. APPLY TO BOTH KNEES    Yes Historical Provider, MD  divalproex (DEPAKOTE SPRINKLE) 125 MG capsule Take 125 mg by mouth 3 (three) times daily.    Yes Historical Provider, MD  gabapentin (NEURONTIN) 100 MG capsule Take 200 mg by mouth 2 (two) times daily.    Yes Historical Provider, MD  guaifenesin (ROBAFEN) 100 MG/5ML syrup Take 200 mg by mouth every 6 (six) hours as needed for cough.   Yes Historical Provider, MD  lactulose, encephalopathy, (GENERLAC) 10 GM/15ML SOLN Take 20 g by mouth daily as needed (CONSTIPATION).   Yes Historical Provider, MD  levothyroxine (SYNTHROID, LEVOTHROID) 88 MCG tablet Take 88 mcg by mouth daily before breakfast.   Yes Historical Provider, MD  loperamide (IMODIUM) 2 MG capsule Take 2 mg by mouth as needed for diarrhea or loose stools.   Yes Historical Provider, MD  LORazepam (ATIVAN) 0.5 MG tablet Take 0.5 mg by mouth 2 (two) times daily.   Yes Historical Provider, MD  magnesium hydroxide (MILK OF MAGNESIA) 400 MG/5ML suspension Take 30 mLs by mouth at bedtime as needed for mild constipation.   Yes Historical Provider, MD  Melatonin 1 MG TABS Take 4 mg by mouth at bedtime.    Yes  Historical Provider, MD  neomycin-bacitracin-polymyxin (NEOSPORIN) ointment Apply 1 application topically as needed for wound care.   Yes Historical Provider, MD  OLANZapine (ZYPREXA) 2.5 MG tablet Take 2.5 mg by mouth every evening.    Yes Historical Provider, MD  PRESCRIPTION MEDICATION Apply 0.5 mLs topically 3 (three) times daily as needed (for severe agitation). *R-Lorazepam Gel 83m/ml*  Apply to skin   Yes Historical Provider, MD  senna-docusate (SENOKOT-S) 8.6-50 MG tablet Take 2 tablets by mouth 2 (two) times daily.   Yes Historical Provider, MD  traZODone (DESYREL) 50 MG tablet Take 50 mg by mouth at bedtime.    Yes Historical Provider, MD  vitamin C (ASCORBIC ACID) 500 MG tablet Take 500 mg by mouth every morning.   Yes Historical Provider, MD    Physical Exam:  Constitutional: Frail elderly lady who appears significantly ill and toxic appearance Vitals:   03/26/16 1930 03/26/16 2011 03/26/16 2030 03/26/16 2100  BP: 114/90 (!) 183/147 (!) 193/178 (!) 187/153  Pulse:  82 77 80  Resp: 17 18 (!) 28 16  Temp:      TempSrc:      SpO2:  99% 100% 100%  Weight:       Eyes: PERRL, lids and conjunctivae normal ENMT: Mucous membranes are dry with white lesions present.  Adentulous Neck: normal, supple, no masses, no thyromegaly Respiratory: Decreased aeration.  Normal respiratory effort.  Cardiovascular: Regular rate and rhythm, no murmurs / rubs / gallops. No extremity edema. 2+ pedal pulses. No carotid bruits.  Abdomen: no tenderness, no masses palpated. No hepatosplenomegaly. Bowel sounds positive.  Musculoskeletal: no clubbing / cyanosis. No joint deformity upper and lower extremities. Good ROM, no contractures.  muscle tone.  Skin: Multiple skin tears in the upper and lower extremities noted. Decreased skin turgor. Neurologic: CN 2-12 grossly intact. Sensation intact, DTR normal. Strength 3-4/5 in all 4.  Psychiatric: Dementia minimally responsive     Labs on Admission: I have  personally reviewed following labs and imaging studies  CBC:  Recent Labs Lab 03/26/16 1848  WBC 9.6  HGB 13.1  HCT 44.1  MCV 100.5*  PLT 1825   Basic Metabolic Panel:  Recent Labs Lab 03/26/16 1848  NA 168*  K 3.8  CL >130*  CO2 26  GLUCOSE 347*  BUN 96*  CREATININE 1.69*  CALCIUM 9.4   GFR: CrCl cannot be calculated (Unknown ideal weight.). Liver Function Tests:  Recent Labs Lab 03/26/16 1848  AST 55*  ALT 39  ALKPHOS 66  BILITOT 0.5  PROT 6.9  ALBUMIN 3.2*    Recent Labs Lab 03/26/16 1848  LIPASE 58*    Recent Labs Lab 03/26/16 1848  AMMONIA 25   Coagulation Profile:  Recent Labs Lab 03/26/16 1848  INR 1.09   Cardiac Enzymes: No results for input(s): CKTOTAL, CKMB, CKMBINDEX, TROPONINI in the last 168 hours. BNP (last 3 results) No results for input(s): PROBNP in the last 8760 hours. HbA1C: No results for input(s): HGBA1C in  the last 72 hours. CBG:  Recent Labs Lab 03/26/16 1811  GLUCAP 317*   Lipid Profile: No results for input(s): CHOL, HDL, LDLCALC, TRIG, CHOLHDL, LDLDIRECT in the last 72 hours. Thyroid Function Tests: No results for input(s): TSH, T4TOTAL, FREET4, T3FREE, THYROIDAB in the last 72 hours. Anemia Panel: No results for input(s): VITAMINB12, FOLATE, FERRITIN, TIBC, IRON, RETICCTPCT in the last 72 hours. Urine analysis:    Component Value Date/Time   COLORURINE YELLOW 03/26/2016 1900   APPEARANCEUR CLOUDY (A) 03/26/2016 1900   LABSPEC >1.030 (H) 03/26/2016 1900   PHURINE 5.0 03/26/2016 1900   GLUCOSEU NEGATIVE 03/26/2016 1900   HGBUR MODERATE (A) 03/26/2016 1900   BILIRUBINUR SMALL (A) 03/26/2016 1900   KETONESUR NEGATIVE 03/26/2016 1900   PROTEINUR 100 (A) 03/26/2016 1900   NITRITE POSITIVE (A) 03/26/2016 1900   LEUKOCYTESUR MODERATE (A) 03/26/2016 1900   Sepsis Labs: No results found for this or any previous visit (from the past 240 hour(s)).   Radiological Exams on Admission: Ct Head Wo  Contrast  Result Date: 03/26/2016 CLINICAL DATA:  Altered mental status EXAM: CT HEAD WITHOUT CONTRAST TECHNIQUE: Contiguous axial images were obtained from the base of the skull through the vertex without intravenous contrast. COMPARISON:  February 26, 2016 FINDINGS: Brain: No subdural, epidural, or subarachnoid hemorrhage. The cerebellum and brainstem are unchanged. Basal cisterns are patent. Severe white matter changes are stable. No acute cortical ischemia or infarct. The ventricles and sulci are prominent but stable. No mass, mass effect, or midline shift. Vascular: Calcified atherosclerosis in the intracranial carotid arteries. Skull: Normal. Negative for fracture or focal lesion. Sinuses/Orbits: No acute finding. Other: None. IMPRESSION: 1. Chronic white matter changes.  No acute intracranial abnormality. Electronically Signed   By: Dorise Bullion III M.D   On: 03/26/2016 18:35   Dg Chest Portable 1 View  Result Date: 03/26/2016 CLINICAL DATA:  Fall EXAM: PORTABLE CHEST 1 VIEW COMPARISON:  02/26/2016 FINDINGS: Normal heart size. Stable mediastinal contours when allowing for distortion by rightward rotation. There is no edema, consolidation, effusion, or pneumothorax. Advanced spondylosis. Severe bilateral glenohumeral osteoarthritis and chronic rotator cuff tears. IMPRESSION: No evidence of acute disease. Electronically Signed   By: Monte Fantasia M.D.   On: 03/26/2016 18:39    EKG: Independently reviewed. Afib 87 bpm  Assessment/Plan Sepsis secondary to urinary tract infection: Sepsis criteria met with lactic acid, tachypnea, and positive urinalysis - Admit to stepdown  - Empiric antibiotics of vancomycin, Zosyn - Foley catheter in place for strict I&O's   Hypernatremia secondary to severe dehydration: Acute patient received a total of approximately 2000 ml of fluid in the ED.   - D51/2 NS IVF at 75 mL per hour, but likely will need to switch D5W fluids at 34m/hr as tolerated if sodium  levels continued to increase. - Recheck sodium levels in a.m.  Acute metabolic encephalopathy/ dementia: Likely related to elevated the above including infection, hypernatremia, and elevated BUN. At baseline patient has dementia but is noted to answer questions. Patient nonverbal at this time. - Neuro checks - npo  - will likely need to check swallow study prior to patient on diet  Acute respiratory distress with hypoxia: Acute. O2 sat strep with without patient being on nonrebreather although chest x-ray and lungs sound relatively clear. Suspect that patient's altered state is likely playing part in decreased oxygenation. - Continuous pulse oximetry with nasal cannula oxygen to keep O2 sats greater than 92%   Suspected thrush: Noted on the anterior and posterior  mouth. Thought to be related to this significant pain malnutrition - Diflucan IV  Acute renal failure: Baseline creatinine 0.78 was just one month ago, but patient presents with creatinine elevated to 1.69 and BUN 96. - Continue IV fluids as tolerated  - Recheck BMP in a.m.  Diabetes mellitus type 2 with hyperglycemia initial blood glucose 347. - CBGs every 4 hours with sensitive a sliding scale insulin   Atrial fibrillation: Currently rate controlled at this time.Chadvasc=>2.   Hypothyroidism - Consider changing levothyroxine to IV if needed    Protonix for prophylaxis DVT prophylaxis: lovenox Code Status: No code Family Communication: No family present at bedside Disposition Plan: TBD  Consults called:  Admission status: Inpatient  Norval Morton MD [facility] Pager [phone]  If 7PM-7AM, please contact night-coverage [email or web address] Password [facility]  03/26/2016, 9:28 PM

## 2016-03-26 NOTE — ED Provider Notes (Signed)
[facility]-EMERGENCY DEPT Provider Note   CSN: [id number] Arrival date & time: 03/26/16  1748     History   Chief Complaint Chief Complaint  Patient presents with  . Altered Mental Status    HPI Cathy Jennings is a 82 y.o. female.  HPI   Since a 82 year old female coming from [place] stroke with "altered mental status for the past 3 days". Patient nonverbal for [place].  In looking at patient's last notes from one month ago she was nonverbal at that time.  Level V caveat nonverbal.  Past Medical History:  Diagnosis Date  . Dementia   . Diabetes mellitus without complication ([facility])   . Fall   . Hypertension   . Renal disorder     There are no active problems to display for this patient.   History reviewed. No pertinent surgical history.  OB History    No data available       Home Medications    Prior to Admission medications   Medication Sig Start Date End Date Taking? Authorizing Provider  acetaminophen (TYLENOL) 325 MG tablet Take 650 mg by mouth 3 (three) times daily.   Yes Historical Provider, MD  acetaminophen (TYLENOL) 500 MG tablet Take 500 mg by mouth every 4 (four) hours as needed for mild pain, moderate pain, fever or headache.   Yes Historical Provider, MD  alendronate (FOSAMAX) 70 MG tablet Take 70 mg by mouth every Wednesday. Take with a full glass of water on an empty stomach.    Yes Historical Provider, MD  alum & mag hydroxide-simeth (MINTOX) 200-200-20 MG/5ML suspension Take 30 mLs by mouth as needed for indigestion or heartburn.   Yes Historical Provider, MD  atorvastatin (LIPITOR) 10 MG tablet Take 10 mg by mouth at bedtime.   Yes Historical Provider, MD  Calcium Carbonate-Vitamin D3 (CALCIUM 600+D3) 600-400 MG-UNIT TABS Take 1 tablet by mouth 2 (two) times daily.   Yes Historical Provider, MD  diclofenac sodium (VOLTAREN) 1 % GEL Apply 4 g topically daily after breakfast. APPLY TO BOTH KNEES    Yes Historical Provider, MD  divalproex (DEPAKOTE  SPRINKLE) 125 MG capsule Take 125 mg by mouth 3 (three) times daily.    Yes Historical Provider, MD  gabapentin (NEURONTIN) 100 MG capsule Take 200 mg by mouth 2 (two) times daily.    Yes Historical Provider, MD  guaifenesin (ROBAFEN) 100 MG/5ML syrup Take 200 mg by mouth every 6 (six) hours as needed for cough.   Yes Historical Provider, MD  lactulose, encephalopathy, (GENERLAC) 10 GM/15ML SOLN Take 20 g by mouth daily as needed (CONSTIPATION).   Yes Historical Provider, MD  levothyroxine (SYNTHROID, LEVOTHROID) 88 MCG tablet Take 88 mcg by mouth daily before breakfast.   Yes Historical Provider, MD  loperamide (IMODIUM) 2 MG capsule Take 2 mg by mouth as needed for diarrhea or loose stools.   Yes Historical Provider, MD  LORazepam (ATIVAN) 0.5 MG tablet Take 0.5 mg by mouth 2 (two) times daily.   Yes Historical Provider, MD  magnesium hydroxide (MILK OF MAGNESIA) 400 MG/5ML suspension Take 30 mLs by mouth at bedtime as needed for mild constipation.   Yes Historical Provider, MD  Melatonin 1 MG TABS Take 4 mg by mouth at bedtime.    Yes Historical Provider, MD  neomycin-bacitracin-polymyxin (NEOSPORIN) ointment Apply 1 application topically as needed for wound care.   Yes Historical Provider, MD  OLANZapine (ZYPREXA) 2.5 MG tablet Take 2.5 mg by mouth every evening.    Yes Historical  Provider, MD  PRESCRIPTION MEDICATION Apply 0.5 mLs topically 3 (three) times daily as needed (for severe agitation). *R-Lorazepam Gel 1mg /ml*  Apply to skin   Yes Historical Provider, MD  senna-docusate (SENOKOT-S) 8.6-50 MG tablet Take 2 tablets by mouth 2 (two) times daily.   Yes Historical Provider, MD  traZODone (DESYREL) 50 MG tablet Take 50 mg by mouth at bedtime.    Yes Historical Provider, MD  vitamin C (ASCORBIC ACID) 500 MG tablet Take 500 mg by mouth every morning.   Yes Historical Provider, MD    Family History No family history on file.  Social History Social History  Substance Use Topics  .  Smoking status: Never Smoker  . Smokeless tobacco: Never Used  . Alcohol use No     Allergies   Patient has no known allergies.   Review of Systems Review of Systems  Unable to perform ROS: Patient nonverbal     Physical Exam Updated Vital Signs There were no vitals taken for this visit.  Physical Exam  Constitutional:  Patient is lying with her mouth open nonverbal.  HENT:  Head: Normocephalic and atraumatic.  Eyes: Right eye exhibits no discharge. Left eye exhibits no discharge.  Cardiovascular: Normal rate, regular rhythm and normal heart sounds.   No murmur heard. Pulmonary/Chest: Effort normal and breath sounds normal.  Abdominal: Soft. She exhibits no distension. There is no tenderness.  Neurological: No cranial nerve deficit.  Patient oriented 0.  Skin: Skin is warm and dry. She is not diaphoretic.  Psychiatric:  Patient grimaces to all touch.  Nursing note and vitals reviewed.    ED Treatments / Results  Labs (all labs ordered are listed, but only abnormal results are displayed) Labs Reviewed  CBG MONITORING, ED - Abnormal; Notable for the following:       Result Value   Glucose-Capillary 317 (*)    All other components within normal limits  URINE CULTURE  COMPREHENSIVE METABOLIC PANEL  CBC  AMMONIA  URINALYSIS, ROUTINE W REFLEX MICROSCOPIC  PROTIME-INR  LIPASE, BLOOD  BLOOD GAS, ARTERIAL  I-STAT CG4 LACTIC ACID, ED  I-STAT TROPOININ, ED    EKG  EKG Interpretation None       Radiology No results found.  Procedures Procedures (including critical care time)  Medications Ordered in ED Medications - No data to display   Initial Impression / Assessment and Plan / ED Course  I have reviewed the triage vital signs and the nursing notes.  Pertinent labs & imaging results that were available during my care of the patient were reviewed by me and considered in my medical decision making (see chart for details).  Clinical Course      Patient is a 82 year old nonverbal female. She is presenting today with "altered mental status". We gotten very little information to go on. However patient is looking mostly the left-hand side and open mouth breathing with responsiveness to pain. GCS 10. Eyes open, localizes pain, non verbal. According to prior notes she is nonverbal at baseline. [facility] to get [employer] info.   Patient hypertensive, normal HR, cold and therefore pulse ox difficult to attain.   Ordered CT head stat.   7:10 PM According to [facility] she has been slowly getting worse over the last several days.  She always has her mouth open, but sometimes hollars out.  [phone] Cathy Jennings. Tried calling [facility].   7:56 PM [phone]  Cathy Jennings . Got it touch with  her at 8 pm.  Last saw patietn Wednesday. She wasn't feeling well at that time.   Discussed. We will make her DNR/DNI per family members request. Will treat with abx for sepsis, will give fluids.   8:27 PM Patient very hypernatremic, likely hypovolemic. Will give normal saline for sepsis and hypovolemia. Have given  vanc zosyn which will cover her UTI.  9:06 PM Confirmed DNR/DNI with patient's daughters daughter.  In addition discussed with critical care that since she is DNR/DNI stepdown is a reasonable placement.    CRITICAL CARE Performed by: Arlana Hoveourteney L Torre Pikus Total critical care time: 60 minutes Critical care time was exclusive of separately billable procedures and treating other patients. Critical care was necessary to treat or prevent imminent or life-threatening deterioration. Critical care was time spent personally by me on the following activities: development of treatment plan with patient and/or surrogate as well as nursing, discussions with consultants, evaluation of patient's response to treatment, examination of patient, obtaining history from patient or surrogate, ordering and performing treatments  and interventions, ordering and review of laboratory studies, ordering and review of radiographic studies, pulse oximetry and re-evaluation of patient's condition.   Final Clinical Impressions(s) / ED Diagnoses   Final diagnoses:  None    New Prescriptions New Prescriptions   No medications on file     Pammy Vesey Randall AnLyn Jillayne Witte, MD 03/31/16 [phone]

## 2016-03-26 NOTE — Progress Notes (Signed)
Pharmacy Antibiotic Note  Cathy Jennings is a 82 y.o. female admitted on 03/26/2016 with sepsis.  Pharmacy has been consulted for vancomycin/Zosyn dosing.   Patient is a nonverbal female. She is presenting today [facility] "altered mental status" [facility].   No H and P currently available.   Plan:  Zosyn 2.25 gr IV q8h   Vancomycin 1000 mg IV x1, then 500 mg IV q48h  Follow up clinical course, renal function, cultures as available.     Weight: 110 lb (49.9 kg)  Temp (24hrs), Avg:97.1 F (36.2 C), Min:97.1 F (36.2 C), Max:97.1 F (36.2 C)   Recent Labs Lab 03/26/16 1848 03/26/16 1859  WBC 9.6  --   CREATININE 1.69*  --   LATICACIDVEN  --  5.42*    CrCl cannot be calculated (Unknown ideal weight.).    No Known Allergies  Antimicrobials this admission: 12/8 Zosyn>>  12/8 vancomycin >>   Dose adjustments this admission: ----  Microbiology results: 12/8 BCx:  12/8 UCx:    Thank you for allowing pharmacy to be a part of this patient's care.   Adalberto ColeNikola Jahzir Strohmeier, PharmD, BCPS Pager [phone] 03/26/2016 8:29 PM

## 2016-03-26 NOTE — ED Notes (Signed)
Lactic acid= 5.42, MD [place] notified

## 2016-03-27 DIAGNOSIS — N179 Acute kidney failure, unspecified: Secondary | ICD-10-CM | POA: Diagnosis present

## 2016-03-27 DIAGNOSIS — N390 Urinary tract infection, site not specified: Secondary | ICD-10-CM | POA: Diagnosis present

## 2016-03-27 DIAGNOSIS — A419 Sepsis, unspecified organism: Principal | ICD-10-CM

## 2016-03-27 DIAGNOSIS — J9601 Acute respiratory failure with hypoxia: Secondary | ICD-10-CM | POA: Diagnosis present

## 2016-03-27 DIAGNOSIS — E039 Hypothyroidism, unspecified: Secondary | ICD-10-CM | POA: Diagnosis present

## 2016-03-27 DIAGNOSIS — E119 Type 2 diabetes mellitus without complications: Secondary | ICD-10-CM

## 2016-03-27 DIAGNOSIS — E870 Hyperosmolality and hypernatremia: Secondary | ICD-10-CM

## 2016-03-27 LAB — COMPREHENSIVE METABOLIC PANEL
ALK PHOS: 58 U/L (ref 38–126)
ALT: 33 U/L (ref 14–54)
AST: 45 U/L — ABNORMAL HIGH (ref 15–41)
Albumin: 2.9 g/dL — ABNORMAL LOW (ref 3.5–5.0)
BILIRUBIN TOTAL: 0.7 mg/dL (ref 0.3–1.2)
BUN: 80 mg/dL — ABNORMAL HIGH (ref 6–20)
CALCIUM: 8.4 mg/dL — AB (ref 8.9–10.3)
CO2: 23 mmol/L (ref 22–32)
CREATININE: 1.37 mg/dL — AB (ref 0.44–1.00)
Chloride: >130 mmol/L (ref 101–111)
GFR calc non Af Amer: 33 mL/min — ABNORMAL LOW (ref >60)
GFR, EST AFRICAN AMERICAN: 38 mL/min — AB (ref >60)
Glucose, Bld: 261 mg/dL — ABNORMAL HIGH (ref 65–99)
Potassium: 3.6 mmol/L (ref 3.5–5.1)
Sodium: 170 mmol/L (ref 135–145)
TOTAL PROTEIN: 5.9 g/dL — AB (ref 6.5–8.1)

## 2016-03-27 LAB — CBC
HEMATOCRIT: 38.1 % (ref 36.0–46.0)
HEMOGLOBIN: 11.6 g/dL — AB (ref 12.0–15.0)
MCH: 30.4 pg (ref 26.0–34.0)
MCHC: 30.4 g/dL (ref 30.0–36.0)
MCV: 99.7 fL (ref 78.0–100.0)
Platelets: 128 10*3/uL — ABNORMAL LOW (ref 150–400)
RBC: 3.82 MIL/uL — ABNORMAL LOW (ref 3.87–5.11)
RDW: 18.5 % — ABNORMAL HIGH (ref 11.5–15.5)
WBC: 11.5 10*3/uL — ABNORMAL HIGH (ref 4.0–10.5)

## 2016-03-27 LAB — SODIUM
SODIUM: 167 mmol/L — AB (ref 135–145)
Sodium: 170 mmol/L (ref 135–145)
Sodium: 165 mmol/L (ref 135–145)

## 2016-03-27 LAB — GLUCOSE, CAPILLARY
GLUCOSE-CAPILLARY: 165 mg/dL — AB (ref 65–99)
GLUCOSE-CAPILLARY: 126 mg/dL — AB (ref 65–99)
GLUCOSE-CAPILLARY: 135 mg/dL — AB (ref 65–99)
Glucose-Capillary: 148 mg/dL — ABNORMAL HIGH (ref 65–99)
Glucose-Capillary: 103 mg/dL — ABNORMAL HIGH (ref 65–99)

## 2016-03-27 LAB — MRSA PCR SCREENING: MRSA BY PCR: NEGATIVE

## 2016-03-27 LAB — LACTIC ACID, PLASMA: Lactic Acid, Venous: 4.4 mmol/L (ref 0.5–1.9)

## 2016-03-27 LAB — OSMOLALITY, URINE: OSMOLALITY UR: 727 mosm/kg (ref 300–900)

## 2016-03-27 LAB — PREALBUMIN: PREALBUMIN: 13.2 mg/dL — AB (ref 18–38)

## 2016-03-27 MED ORDER — LEVOTHYROXINE SODIUM 100 MCG IV SOLR
45.0000 ug | Freq: Every day | INTRAVENOUS | Status: DC
  Administered 2016-03-27 – 2016-03-28 (×2): 45 ug via INTRAVENOUS
  Filled 2016-03-27 (×2): qty 5

## 2016-03-27 MED ORDER — IPRATROPIUM-ALBUTEROL 0.5-2.5 (3) MG/3ML IN SOLN
3.0000 mL | Freq: Four times a day (QID) | RESPIRATORY_TRACT | Status: DC
  Administered 2016-03-27: 3 mL via RESPIRATORY_TRACT
  Filled 2016-03-27: qty 3

## 2016-03-27 MED ORDER — MORPHINE SULFATE (CONCENTRATE) 10 MG/0.5ML PO SOLN: 10.0000 mg | ORAL | Status: DC | PRN

## 2016-03-27 MED ORDER — LORAZEPAM 2 MG/ML IJ SOLN: 1.0000 mg | INTRAMUSCULAR | Status: DC | PRN

## 2016-03-27 MED ORDER — DEXTROSE-NACL 5-0.45 % IV SOLN
INTRAVENOUS | Status: DC
  Administered 2016-03-27: 75 mL via INTRAVENOUS

## 2016-03-27 MED ORDER — IPRATROPIUM-ALBUTEROL 0.5-2.5 (3) MG/3ML IN SOLN: 3.0000 mL | RESPIRATORY_TRACT | Status: DC | PRN

## 2016-03-27 MED ORDER — DEXTROSE 5 % IV SOLN
INTRAVENOUS | Status: DC
  Administered 2016-03-27: 1000 mL via INTRAVENOUS

## 2016-03-27 MED ORDER — MORPHINE SULFATE (PF) 2 MG/ML IV SOLN
1.0000 mg | INTRAVENOUS | Status: DC | PRN
  Administered 2016-03-27 – 2016-03-30 (×7): 1 mg via INTRAVENOUS
  Filled 2016-03-27 (×7): qty 1

## 2016-03-27 MED ORDER — FLUCONAZOLE IN SODIUM CHLORIDE 100-0.9 MG/50ML-% IV SOLN
50.0000 mg | Freq: Every day | INTRAVENOUS | Status: DC
  Administered 2016-03-29: 50 mg via INTRAVENOUS
  Filled 2016-03-27 (×4): qty 25

## 2016-03-27 MED ORDER — PANTOPRAZOLE SODIUM 40 MG IV SOLR
40.0000 mg | Freq: Two times a day (BID) | INTRAVENOUS | Status: DC
  Administered 2016-03-27 – 2016-03-30 (×5): 40 mg via INTRAVENOUS
  Filled 2016-03-27 (×5): qty 40

## 2016-03-27 MED ORDER — PIPERACILLIN-TAZOBACTAM 3.375 G IVPB
3.3750 g | Freq: Three times a day (TID) | INTRAVENOUS | Status: DC
  Administered 2016-03-27: 3.375 g via INTRAVENOUS
  Filled 2016-03-27: qty 50

## 2016-03-27 NOTE — Progress Notes (Signed)
PROGRESS NOTE    Cathy Jennings  TJQ:[id number] DOB: 1922-10-20 DOA: 03/26/2016 PCP: Sande Brothers, MD    Brief Narrative:  82 y.o. female with medical history significant of HTN, dementia, diabetes mellitus type 2; who presented for being acutely altered. In the ED, patient noted to have sodium of 168 with Cr of 1.69. Patient was admitted for further work up.  Assessment & Plan:   Principal Problem:   Sepsis ([place]) Active Problems:   Hypernatremia   Infection of urinary tract   Acute respiratory failure with hypoxia (HCC)   Acute renal failure (HCC)   Diabetes mellitus type 2 in nonobese (HCC)   Hypothyroidism  Sepsis secondary to urinary tract infection:  - Sepsis criteria met with lactic acid, tachypnea, and positive urinalysis - Patient is continued on empiric antibiotics  Hypernatremia secondary to severe dehydration:  -Labs reviewed -Ordered q4h sodium levels -Changed IVF to d5W at 100cc/hr -sodium reviewed. Initially improved to 165, but now rising again to 167 -Discussed with both patient granddaughter and daughter listed. Both are in agreement with full comfort and transition to hospice. -Given hypernatremia and poor PO intake, prognosis is likely less than 2 weeks. Have consulted SW for residential hospice placement. Family desires placement in [place] if  Possible  Acute metabolic encephalopathy/ dementia: -Likely combination of UTi, hypernatremia, worsening dementia  Acute respiratory distress with hypoxia:  -Comfort measures now -Cont O2 for comfort only -Will order PRN morphine for air hunger  Suspected thrush:  -Noted on the anterior and posterior mouth. Thought to be related to this significant pain malnutrition - will continue diflucan for now  Acute renal failure:  -Baseline creatinine 0.78 was just one month ago, but patient presents with creatinine elevated to 1.69 and BUN 96. -LIkley secondary to marked dehydration -Now full comfort  Diabetes  mellitus type 2 with hyperglycemia -full comfort -hold further finger sticks   Atrial fibrillation:  -Comfort care -Will d/c tele   Hypothyroidism -Now full comfort -d/c thyroid replacement   DVT prophylaxis: comfort care Code Status: DNR/comfort Family Communication: Patient's daughter and granddaughter over phone Disposition Plan: Residential hospice vs hospital death  Consultants:     Procedures:     Antimicrobials: Anti-infectives    Start     Dose/Rate Route Frequency Ordered Stop   03/28/16 2000  vancomycin (VANCOCIN) 500 mg in sodium chloride 0.9 % 100 mL IVPB     500 mg 100 mL/hr over 60 Minutes Intravenous Every 48 hours 03/26/16 2020     03/27/16 2200  fluconazole (DIFLUCAN) IVPB 50 mg     50 mg 25 mL/hr over 60 Minutes Intravenous Daily at bedtime 03/27/16 1103     03/27/16 1200  piperacillin-tazobactam (ZOSYN) IVPB 3.375 g     3.375 g 12.5 mL/hr over 240 Minutes Intravenous Every 8 hours 03/27/16 1030     03/27/16 0400  piperacillin-tazobactam (ZOSYN) IVPB 2.25 g  Status:  Discontinued     2.25 g 100 mL/hr over 30 Minutes Intravenous Every 8 hours 03/26/16 2020 03/27/16 1030   03/26/16 2230  fluconazole (DIFLUCAN) IVPB 200 mg  Status:  Discontinued     200 mg 100 mL/hr over 60 Minutes Intravenous Daily at bedtime 03/26/16 2220 03/27/16 1103   03/26/16 1915  vancomycin (VANCOCIN) IVPB 1000 mg/200 mL premix     1,000 mg 200 mL/hr over 60 Minutes Intravenous  Once 03/26/16 1914 03/26/16 2059   03/26/16 1915  piperacillin-tazobactam (ZOSYN) IVPB 3.375 g     3.375 g 100 mL/hr over  30 Minutes Intravenous  Once 03/26/16 1914 03/26/16 1959       Subjective: Unable to obtain  Objective: Vitals:   03/27/16 1300 03/27/16 1400 03/27/16 1500 03/27/16 1600  BP: (!) 116/50 126/67 136/88   Pulse: 63 72 61 68  Resp: 16 18 19 14   Temp: 98.4 F (36.9 C) 98.6 F (37 C) 98.6 F (37 C) 98.6 F (37 C)  TempSrc:      SpO2: 100% 100% 90% 100%  Weight:        Height:        Intake/Output Summary (Last 24 hours) at 03/27/16 1638 Last data filed at 03/27/16 1600  Gross per 24 hour  Intake          1560.92 ml  Output              350 ml  Net          1210.92 ml   Filed Weights   03/26/16 1900 03/26/16 2300  Weight: 49.9 kg (110 lb) 49.1 kg (108 lb 3.9 oz)    Examination:  General exam:Lethargic, arousable  Respiratory system: Clear to auscultation. Respiratory effort normal. Cardiovascular system: S1 & S2 heard, RRR Gastrointestinal system: Abdomen is nondistended, soft and nontender. No organomegaly or masses felt. Normal bowel sounds heard. Central nervous system: Alert and oriented. No focal neurological deficits. Extremities: Symmetric 5 x 5 power. Skin: No rashes, lesions Psychiatry: confused, intermittently screaming out. Difficult to fully assess.   Data Reviewed: I have personally reviewed following labs and imaging studies  CBC:  Recent Labs Lab 03/26/16 1848 03/27/16 0112  WBC 9.6 11.5*  HGB 13.1 11.6*  HCT 44.1 38.1  MCV 100.5* 99.7  PLT 149* 250*   Basic Metabolic Panel:  Recent Labs Lab 03/26/16 1848 03/27/16 0112 03/27/16 0633 03/27/16 1130 03/27/16 1521  NA 168* 170* 170* 165* 167*  K 3.8 3.6  --   --   --   CL >130* >130*  --   --   --   CO2 26 23  --   --   --   GLUCOSE 347* 261*  --   --   --   BUN 96* 80*  --   --   --   CREATININE 1.69* 1.37*  --   --   --   CALCIUM 9.4 8.4*  --   --   --    GFR: Estimated Creatinine Clearance: 20.7 mL/min (by C-G formula based on SCr of 1.37 mg/dL (H)). Liver Function Tests:  Recent Labs Lab 03/26/16 1848 03/27/16 0112  AST 55* 45*  ALT 39 33  ALKPHOS 66 58  BILITOT 0.5 0.7  PROT 6.9 5.9*  ALBUMIN 3.2* 2.9*    Recent Labs Lab 03/26/16 1848  LIPASE 58*    Recent Labs Lab 03/26/16 1848  AMMONIA 25   Coagulation Profile:  Recent Labs Lab 03/26/16 1848  INR 1.09   Cardiac Enzymes: No results for input(s): CKTOTAL, CKMB, CKMBINDEX,  TROPONINI in the last 168 hours. BNP (last 3 results) No results for input(s): PROBNP in the last 8760 hours. HbA1C: No results for input(s): HGBA1C in the last 72 hours. CBG:  Recent Labs Lab 03/26/16 2343 03/27/16 0548 03/27/16 0752 03/27/16 1201 03/27/16 1602  GLUCAP 265* 165* 148* 126* 103*   Lipid Profile: No results for input(s): CHOL, HDL, LDLCALC, TRIG, CHOLHDL, LDLDIRECT in the last 72 hours. Thyroid Function Tests: No results for input(s): TSH, T4TOTAL, FREET4, T3FREE, THYROIDAB in the last 72  hours. Anemia Panel: No results for input(s): VITAMINB12, FOLATE, FERRITIN, TIBC, IRON, RETICCTPCT in the last 72 hours. Sepsis Labs:  Recent Labs Lab 03/26/16 1859 03/26/16 2135 03/27/16 0112  LATICACIDVEN 5.42* 4.09* 4.4*    Recent Results (from the past 240 hour(s))  Blood Culture (routine x 2)     Status: None (Preliminary result)   Collection Time: 03/26/16  7:27 PM  Result Value Ref Range Status   Specimen Description BLOOD RIGHT ANTECUBITAL  Final   Special Requests BOTTLES DRAWN AEROBIC ONLY 8ML  Final   Culture   Final    NO GROWTH < 24 HOURS Performed at [facility]    Report Status PENDING  Incomplete  MRSA PCR Screening     Status: None   Collection Time: 03/26/16 11:10 PM  Result Value Ref Range Status   MRSA by PCR NEGATIVE NEGATIVE Final    Comment:        The GeneXpert MRSA Assay (FDA approved for NASAL specimens only), is one component of a comprehensive MRSA colonization surveillance program. It is not intended to diagnose MRSA infection nor to guide or monitor treatment for MRSA infections.      Radiology Studies: Ct Head Wo Contrast  Result Date: 03/26/2016 CLINICAL DATA:  Altered mental status EXAM: CT HEAD WITHOUT CONTRAST TECHNIQUE: Contiguous axial images were obtained from the base of the skull through the vertex without intravenous contrast. COMPARISON:  February 26, 2016 FINDINGS: Brain: No subdural, epidural, or  subarachnoid hemorrhage. The cerebellum and brainstem are unchanged. Basal cisterns are patent. Severe white matter changes are stable. No acute cortical ischemia or infarct. The ventricles and sulci are prominent but stable. No mass, mass effect, or midline shift. Vascular: Calcified atherosclerosis in the intracranial carotid arteries. Skull: Normal. Negative for fracture or focal lesion. Sinuses/Orbits: No acute finding. Other: None. IMPRESSION: 1. Chronic white matter changes.  No acute intracranial abnormality. Electronically Signed   By: Dorise Bullion III M.D   On: 03/26/2016 18:35   Dg Chest Portable 1 View  Result Date: 03/26/2016 CLINICAL DATA:  Fall EXAM: PORTABLE CHEST 1 VIEW COMPARISON:  02/26/2016 FINDINGS: Normal heart size. Stable mediastinal contours when allowing for distortion by rightward rotation. There is no edema, consolidation, effusion, or pneumothorax. Advanced spondylosis. Severe bilateral glenohumeral osteoarthritis and chronic rotator cuff tears. IMPRESSION: No evidence of acute disease. Electronically Signed   By: Monte Fantasia M.D.   On: 03/26/2016 18:39    Scheduled Meds: . chlorhexidine  15 mL Mouth Rinse BID  . enoxaparin (LOVENOX) injection  40 mg Subcutaneous QHS  . fluconazole (DIFLUCAN) IV  50 mg Intravenous QHS  . insulin aspart  0-9 Units Subcutaneous Q4H  . levothyroxine  45 mcg Intravenous Daily  . mouth rinse  15 mL Mouth Rinse q12n4p  . pantoprazole (PROTONIX) IV  40 mg Intravenous Q12H  . piperacillin-tazobactam (ZOSYN)  IV  3.375 g Intravenous Q8H  . sodium chloride flush  3 mL Intravenous Q12H  . [START ON 03/28/2016] vancomycin  500 mg Intravenous Q48H   Continuous Infusions: . dextrose 1,000 mL (03/27/16 0805)     LOS: 1 day   CHIU, Orpah Melter, MD [facility] Pager [phone]  If 7PM-7AM, please contact night-coverage [email or web address] Password [email or web address] 03/27/2016, 4:38 PM

## 2016-03-27 NOTE — Progress Notes (Signed)
Pharmacy Antibiotic Note  Cathy LenisLeila Jennings is a 82 y.o. female admitted on 03/26/2016 with sepsis.  Pharmacy has been consulted for vancomycin/Zosyn dosing.  89 yoF from SNF. To ED 12/8 with AMS. Hx of HTN, DM2, dementia. Non-verbal at facility x 2-3 days, and refusing meds. Found: HyperNa, elevated BP, required O2 via mask, sepsis protocol. Probable urosepsis, oral thrush noted  Plan:  Zosyn 3.375gm q8 - 4 hr infusion  Vancomycin 1000 mg IV x1, then 500 mg IV q48h  Fluconazole 200mg  IV q24 (per MD) for thrush  Follow up clinical course, renal function, cultures as available.   Height: 5\' 2"  (157.5 cm) Weight: 108 lb 3.9 oz (49.1 kg) IBW/kg (Calculated) : 50.1  Temp (24hrs), Avg:98.6 F (37 C), Min:97.1 F (36.2 C), Max:99.1 F (37.3 C)   Recent Labs Lab 03/26/16 1848 03/26/16 1859 03/26/16 2135 03/27/16 0112  WBC 9.6  --   --  11.5*  CREATININE 1.69*  --   --  1.37*  LATICACIDVEN  --  5.42* 4.09* 4.4*    Estimated Creatinine Clearance: 20.7 mL/min (by C-G formula based on SCr of 1.37 mg/dL (H)).    No Known Allergies  Antimicrobials this admission: 12/8 Zosyn>>  12/8 vancomycin >>  12/8 fluconazole >>  Dose adjustments this admission: 12/9: increase Zosyn to 3.375gm q8 - 4 hr infusion for improved renal function  Microbiology results: 12/8 BCx: sent 12/8 UCx:  sent 12/8 MRSA PCR: neg  Thank you for allowing pharmacy to be a part of this patient's care.  Otho BellowsGreen, Takina Busser L PharmD Pager [phone] 03/27/2016, 10:42 AM

## 2016-03-27 NOTE — Progress Notes (Signed)
Pt. has been made full comfort care. Labs(CBG)  and medications that were due at 2200 will be discontinued (per verbal order confirmation by MD). Pain being managed by IV Morphine.  Awaiting bed for transfer to floor.

## 2016-03-27 NOTE — Progress Notes (Signed)
CRITICAL VALUE ALERT  Critical value received:  Na 170; Cl >130; Na 170  Date of notification:  03/27/16  Time of notification:  0154  Critical value read back:Yes.    Nurse who received alert:  Reuel BoomQueeneth Jazline Cumbee, RN  MD notified (1st page):  Elray McgregorMary Lynch, NP  Time of first page:  0200  MD notified (2nd page):  Time of second page:  Responding MD:  Elray McgregorMary Lynch, NP  Time MD responded:  0230. New order recieved

## 2016-03-28 LAB — GLUCOSE, CAPILLARY: GLUCOSE-CAPILLARY: 135 mg/dL — AB (ref 65–99)

## 2016-03-28 NOTE — Progress Notes (Signed)
Pt turned q 3hr. Pillow between legs. Pt remains in fetal position. Family in to visit.

## 2016-03-28 NOTE — Progress Notes (Signed)
PROGRESS NOTE    Cathy Jennings  VZD:[id number] DOB: Jan 28, 1923 DOA: 03/26/2016 PCP: Sande Brothers, MD    Brief Narrative:  82 y.o. female with medical history significant of HTN, dementia, diabetes mellitus type 2; who presented for being acutely altered. In the ED, patient noted to have sodium of 168 with Cr of 1.69. Patient was admitted for further work up.  Assessment & Plan:   Principal Problem:   Sepsis ([place]) Active Problems:   Hypernatremia   Infection of urinary tract   Acute respiratory failure with hypoxia (HCC)   Acute renal failure (HCC)   Diabetes mellitus type 2 in nonobese (HCC)   Hypothyroidism  Sepsis secondary to urinary tract infection:  - Sepsis criteria met with lactic acid, tachypnea, and positive urinalysis - Patient is continued on empiric antibiotics  Hypernatremia secondary to severe dehydration:  -Labs reviewed -Ordered q4h sodium levels -Changed IVF to d5W at 100cc/hr -sodium reviewed. Initially improved to 165, but now rising again to 167 -Discussed with both patient granddaughter and daughter listed. Both are in agreement with full comfort and transition to hospice. -Given hypernatremia and poor PO intake, prognosis is likely less than 2 weeks. Have consulted SW for residential hospice placement. Family desires placement in [place] if possible - Remains stable. Appears comfortable this AM  Acute metabolic encephalopathy/ dementia: -Likely combination of UTi, hypernatremia, worsening dementia -Appears stable this am  Acute respiratory distress with hypoxia:  -Comfort measures now -Cont O2 for comfort only -cont PRN morphine for air hunger  Suspected thrush:  -Noted on the anterior and posterior mouth. Thought to be related to this significant pain malnutrition -patient is continued on diflucan - Stable  Acute renal failure:  -Baseline creatinine 0.78 was just one month ago, but patient presents with creatinine elevated to 1.69 and BUN  96. -LIkley secondary to marked dehydration -Now full comfort -Stable currently -cont indwelling foley cath for comfort  Diabetes mellitus type 2 with hyperglycemia -full comfort -hold further finger sticks -appears stable   Atrial fibrillation:  -Comfort care -off tele  Hypothyroidism -Now full comfort -d/c thyroid replacement   DVT prophylaxis: comfort care Code Status: DNR/comfort Family Communication: Patient's daughter and granddaughter over phone Disposition Plan: Residential hospice vs hospital death  Consultants:     Procedures:     Antimicrobials: Anti-infectives    Start     Dose/Rate Route Frequency Ordered Stop   03/28/16 2000  vancomycin (VANCOCIN) 500 mg in sodium chloride 0.9 % 100 mL IVPB  Status:  Discontinued     500 mg 100 mL/hr over 60 Minutes Intravenous Every 48 hours 03/26/16 2020 03/27/16 1726   03/27/16 2200  fluconazole (DIFLUCAN) IVPB 50 mg     50 mg 25 mL/hr over 60 Minutes Intravenous Daily at bedtime 03/27/16 1103     03/27/16 1200  piperacillin-tazobactam (ZOSYN) IVPB 3.375 g  Status:  Discontinued     3.375 g 12.5 mL/hr over 240 Minutes Intravenous Every 8 hours 03/27/16 1030 03/27/16 1726   03/27/16 0400  piperacillin-tazobactam (ZOSYN) IVPB 2.25 g  Status:  Discontinued     2.25 g 100 mL/hr over 30 Minutes Intravenous Every 8 hours 03/26/16 2020 03/27/16 1030   03/26/16 2230  fluconazole (DIFLUCAN) IVPB 200 mg  Status:  Discontinued     200 mg 100 mL/hr over 60 Minutes Intravenous Daily at bedtime 03/26/16 2220 03/27/16 1103   03/26/16 1915  vancomycin (VANCOCIN) IVPB 1000 mg/200 mL premix     1,000 mg 200 mL/hr over 60 Minutes  Intravenous  Once 03/26/16 1914 03/26/16 2059   03/26/16 1915  piperacillin-tazobactam (ZOSYN) IVPB 3.375 g     3.375 g 100 mL/hr over 30 Minutes Intravenous  Once 03/26/16 1914 03/26/16 1959      Subjective: Cannot obtain from patient  Objective: Vitals:   03/28/16 0800 03/28/16 0900 03/28/16  1000 03/28/16 1044  BP:    (!) 83/44  Pulse: 68 66 66 63  Resp: (!) 22 (!) 21 (!) 21 20  Temp: 99.9 F (37.7 C) 99.7 F (37.6 C) 99.9 F (37.7 C) 99.6 F (37.6 C)  TempSrc: Oral   Axillary  SpO2: 98% 100% 100% 100%  Weight:    50.3 kg (110 lb 14.3 oz)  Height:        Intake/Output Summary (Last 24 hours) at 03/28/16 1420 Last data filed at 03/28/16 0800  Gross per 24 hour  Intake              400 ml  Output              400 ml  Net                0 ml   Filed Weights   03/26/16 1900 03/26/16 2300 03/28/16 1044  Weight: 49.9 kg (110 lb) 49.1 kg (108 lb 3.9 oz) 50.3 kg (110 lb 14.3 oz)    Examination:  General exam:Lethargic, arousable  Respiratory system: Clear to auscultation. Respiratory effort normal. Cardiovascular system: S1 & S2 heard, RRR Gastrointestinal system: Abdomen is nondistended, soft and nontender. No organomegaly or masses felt. Normal bowel sounds heard. Central nervous system: Alert and oriented. No focal neurological deficits. Extremities: Symmetric 5 x 5 power. Skin: No rashes, lesions Psychiatry: confused, intermittently screaming out. Difficult to fully assess.   Data Reviewed: I have personally reviewed following labs and imaging studies  CBC:  Recent Labs Lab 03/26/16 1848 03/27/16 0112  WBC 9.6 11.5*  HGB 13.1 11.6*  HCT 44.1 38.1  MCV 100.5* 99.7  PLT 149* 161*   Basic Metabolic Panel:  Recent Labs Lab 03/26/16 1848 03/27/16 0112 03/27/16 0633 03/27/16 1130 03/27/16 1521  NA 168* 170* 170* 165* 167*  K 3.8 3.6  --   --   --   CL >130* >130*  --   --   --   CO2 26 23  --   --   --   GLUCOSE 347* 261*  --   --   --   BUN 96* 80*  --   --   --   CREATININE 1.69* 1.37*  --   --   --   CALCIUM 9.4 8.4*  --   --   --    GFR: Estimated Creatinine Clearance: 21.2 mL/min (by C-G formula based on SCr of 1.37 mg/dL (H)). Liver Function Tests:  Recent Labs Lab 03/26/16 1848 03/27/16 0112  AST 55* 45*  ALT 39 33  ALKPHOS 66  58  BILITOT 0.5 0.7  PROT 6.9 5.9*  ALBUMIN 3.2* 2.9*    Recent Labs Lab 03/26/16 1848  LIPASE 58*    Recent Labs Lab 03/26/16 1848  AMMONIA 25   Coagulation Profile:  Recent Labs Lab 03/26/16 1848  INR 1.09   Cardiac Enzymes: No results for input(s): CKTOTAL, CKMB, CKMBINDEX, TROPONINI in the last 168 hours. BNP (last 3 results) No results for input(s): PROBNP in the last 8760 hours. HbA1C: No results for input(s): HGBA1C in the last 72 hours. CBG:  Recent Labs Lab 03/27/16  0277 03/27/16 1201 03/27/16 1602 03/27/16 1941 03/28/16 0736  GLUCAP 148* 126* 103* 135* 135*   Lipid Profile: No results for input(s): CHOL, HDL, LDLCALC, TRIG, CHOLHDL, LDLDIRECT in the last 72 hours. Thyroid Function Tests: No results for input(s): TSH, T4TOTAL, FREET4, T3FREE, THYROIDAB in the last 72 hours. Anemia Panel: No results for input(s): VITAMINB12, FOLATE, FERRITIN, TIBC, IRON, RETICCTPCT in the last 72 hours. Sepsis Labs:  Recent Labs Lab 03/26/16 1859 03/26/16 2135 03/27/16 0112  LATICACIDVEN 5.42* 4.09* 4.4*    Recent Results (from the past 240 hour(s))  Urine culture     Status: Abnormal (Preliminary result)   Collection Time: 03/26/16  7:00 PM  Result Value Ref Range Status   Specimen Description URINE, RANDOM  Final   Special Requests NONE  Final   Culture >=100,000 COLONIES/mL ESCHERICHIA COLI (A)  Final   Report Status PENDING  Incomplete  Blood Culture (routine x 2)     Status: None (Preliminary result)   Collection Time: 03/26/16  7:27 PM  Result Value Ref Range Status   Specimen Description BLOOD RIGHT ANTECUBITAL  Final   Special Requests BOTTLES DRAWN AEROBIC ONLY 8ML  Final   Culture   Final    NO GROWTH < 24 HOURS Performed at [facility]    Report Status PENDING  Incomplete  MRSA PCR Screening     Status: None   Collection Time: 03/26/16 11:10 PM  Result Value Ref Range Status   MRSA by PCR NEGATIVE NEGATIVE Final    Comment:         The GeneXpert MRSA Assay (FDA approved for NASAL specimens only), is one component of a comprehensive MRSA colonization surveillance program. It is not intended to diagnose MRSA infection nor to guide or monitor treatment for MRSA infections.      Radiology Studies: Ct Head Wo Contrast  Result Date: 03/26/2016 CLINICAL DATA:  Altered mental status EXAM: CT HEAD WITHOUT CONTRAST TECHNIQUE: Contiguous axial images were obtained from the base of the skull through the vertex without intravenous contrast. COMPARISON:  February 26, 2016 FINDINGS: Brain: No subdural, epidural, or subarachnoid hemorrhage. The cerebellum and brainstem are unchanged. Basal cisterns are patent. Severe white matter changes are stable. No acute cortical ischemia or infarct. The ventricles and sulci are prominent but stable. No mass, mass effect, or midline shift. Vascular: Calcified atherosclerosis in the intracranial carotid arteries. Skull: Normal. Negative for fracture or focal lesion. Sinuses/Orbits: No acute finding. Other: None. IMPRESSION: 1. Chronic white matter changes.  No acute intracranial abnormality. Electronically Signed   By: Dorise Bullion III M.D   On: 03/26/2016 18:35   Dg Chest Portable 1 View  Result Date: 03/26/2016 CLINICAL DATA:  Fall EXAM: PORTABLE CHEST 1 VIEW COMPARISON:  02/26/2016 FINDINGS: Normal heart size. Stable mediastinal contours when allowing for distortion by rightward rotation. There is no edema, consolidation, effusion, or pneumothorax. Advanced spondylosis. Severe bilateral glenohumeral osteoarthritis and chronic rotator cuff tears. IMPRESSION: No evidence of acute disease. Electronically Signed   By: Monte Fantasia M.D.   On: 03/26/2016 18:39    Scheduled Meds: . chlorhexidine  15 mL Mouth Rinse BID  . fluconazole (DIFLUCAN) IV  50 mg Intravenous QHS  . mouth rinse  15 mL Mouth Rinse q12n4p  . pantoprazole (PROTONIX) IV  40 mg Intravenous Q12H  . sodium chloride flush  3 mL  Intravenous Q12H   Continuous Infusions:    LOS: 2 days   CHIU, Orpah Melter, MD [facility] Pager [phone]  If 7PM-7AM, please contact night-coverage [email or web address] Password [email or web address] 03/28/2016, 2:20 PM

## 2016-03-29 MED ORDER — DEXTROSE 5 % IV SOLN
1.0000 g | INTRAVENOUS | Status: AC
  Administered 2016-03-29: 1 g via INTRAVENOUS
  Filled 2016-03-29: qty 10

## 2016-03-29 MED ORDER — DEXTROSE 5 % IV SOLN
1.0000 g | INTRAVENOUS | Status: DC
  Administered 2016-03-30: 1 g via INTRAVENOUS
  Filled 2016-03-29: qty 10

## 2016-03-29 NOTE — Progress Notes (Signed)
[facility] and [facility] currently [facility]. [facility] will [facility] with family in the morning and let CSW know if they have a bed for DC tomorrow.   Stacy GardnerErin Fauna Neuner, LCSWA Clinical Social Worker [phone]

## 2016-03-29 NOTE — Progress Notes (Signed)
CSW consulted to assist with residential hospice home placement. CSW spoke with pt's daughters, Cathy Jennings and Cathy Jennings to provide hospice choice. Pt is unable to participate in d/c planning due to medical status. [facility]. Referral has been made to [place] at hospice. A decision is pending. CSW will update family / MD / nsg once a decision has been made.  Cori RazorJamie Lamerle Jabs LCSW [phone]

## 2016-03-29 NOTE — Progress Notes (Signed)
PROGRESS NOTE    Cathy Jennings  QQI:[id number] DOB: February 18, 1923 DOA: 03/26/2016 PCP: Sande Brothers, MD    Brief Narrative:  82 y.o. female with medical history significant of HTN, dementia, diabetes mellitus type 2; who presented for being acutely altered. In the ED, patient noted to have sodium of 168 with Cr of 1.69. Patient was admitted for further work up.  Assessment & Plan:   Principal Problem:   Sepsis ([place]) Active Problems:   Hypernatremia   Infection of urinary tract   Acute respiratory failure with hypoxia (HCC)   Acute renal failure (HCC)   Diabetes mellitus type 2 in nonobese (HCC)   Hypothyroidism  Sepsis secondary to urinary tract infection:  - Sepsis criteria met with lactic acid, tachypnea, and positive urinalysis - Urine cx has grown >100,000 ecoli species - Will continue on rocephin for now  Hypernatremia secondary to severe dehydration:  -Labs revealing of severe hypernatremia that was difficult to improve with hypotonic IVF -Discussed with both patient granddaughter and daughter listed. Both are in agreement with full comfort and transition to hospice. -Given hypernatremia and poor PO intake, prognosis is likely less than 2 weeks.  - discussed with social work for placement to residential hospice. - Today, patient remains confused, is awake. comfortable  Acute metabolic encephalopathy/ dementia: -Likely combination of UTi, hypernatremia, worsening dementia -Appears stable today  Acute respiratory distress with hypoxia:  -Comfort measures now -Cont O2 for comfort only -cont PRN morphine for air hunger  Suspected thrush:  -Noted on the anterior and posterior mouth. Thought to be related to this significant pain malnutrition -patient is continued on diflucan - Stable  Acute renal failure:  -Baseline creatinine 0.78 was just one month ago, but patient presents with creatinine elevated to 1.69 and BUN 96. -LIkley secondary to marked dehydration -Now  full comfort -cont indwelling foley cath for comfort  Diabetes mellitus type 2 with hyperglycemia -full comfort -hold further finger sticks -appears stable   Atrial fibrillation:  -Comfort care -off tele  Hypothyroidism -Now full comfort -d/c thyroid replacement   DVT prophylaxis: comfort care Code Status: DNR/comfort Family Communication: Patient's daughter and granddaughter over phone Disposition Plan: Residential hospice vs hospital death  Consultants:     Procedures:     Antimicrobials: Anti-infectives    Start     Dose/Rate Route Frequency Ordered Stop   03/30/16 1200  cefTRIAXone (ROCEPHIN) 1 g in dextrose 5 % 50 mL IVPB     1 g 100 mL/hr over 30 Minutes Intravenous Every 24 hours 03/29/16 1157     03/29/16 1200  cefTRIAXone (ROCEPHIN) 1 g in dextrose 5 % 50 mL IVPB     1 g 100 mL/hr over 30 Minutes Intravenous STAT 03/29/16 1157 03/29/16 1446   03/28/16 2000  vancomycin (VANCOCIN) 500 mg in sodium chloride 0.9 % 100 mL IVPB  Status:  Discontinued     500 mg 100 mL/hr over 60 Minutes Intravenous Every 48 hours 03/26/16 2020 03/27/16 1726   03/27/16 2200  fluconazole (DIFLUCAN) IVPB 50 mg     50 mg 25 mL/hr over 60 Minutes Intravenous Daily at bedtime 03/27/16 1103     03/27/16 1200  piperacillin-tazobactam (ZOSYN) IVPB 3.375 g  Status:  Discontinued     3.375 g 12.5 mL/hr over 240 Minutes Intravenous Every 8 hours 03/27/16 1030 03/27/16 1726   03/27/16 0400  piperacillin-tazobactam (ZOSYN) IVPB 2.25 g  Status:  Discontinued     2.25 g 100 mL/hr over 30 Minutes Intravenous Every 8 hours  03/26/16 2020 03/27/16 1030   03/26/16 2230  fluconazole (DIFLUCAN) IVPB 200 mg  Status:  Discontinued     200 mg 100 mL/hr over 60 Minutes Intravenous Daily at bedtime 03/26/16 2220 03/27/16 1103   03/26/16 1915  vancomycin (VANCOCIN) IVPB 1000 mg/200 mL premix     1,000 mg 200 mL/hr over 60 Minutes Intravenous  Once 03/26/16 1914 03/26/16 2059   03/26/16 1915   piperacillin-tazobactam (ZOSYN) IVPB 3.375 g     3.375 g 100 mL/hr over 30 Minutes Intravenous  Once 03/26/16 1914 03/26/16 1959      Subjective: Unable to obtain  Objective: Vitals:   03/28/16 0900 03/28/16 1000 03/28/16 1044 03/29/16 0520  BP:   (!) 83/44 (!) 99/48  Pulse: 66 66 63 64  Resp: (!) 21 (!) 21 20 (!) 22  Temp: 99.7 F (37.6 C) 99.9 F (37.7 C) 99.6 F (37.6 C) 98.6 F (37 C)  TempSrc:   Axillary Oral  SpO2: 100% 100% 100% 100%  Weight:   50.3 kg (110 lb 14.3 oz)   Height:        Intake/Output Summary (Last 24 hours) at 03/29/16 1526 Last data filed at 03/29/16 1439  Gross per 24 hour  Intake                0 ml  Output              725 ml  Net             -725 ml   Filed Weights   03/26/16 1900 03/26/16 2300 03/28/16 1044  Weight: 49.9 kg (110 lb) 49.1 kg (108 lb 3.9 oz) 50.3 kg (110 lb 14.3 oz)    Examination:  General exam:awake, confused, appears comfortable  Respiratory system: Clear to auscultation. Respiratory effort normal. Cardiovascular system: S1 & S2 heard, RRR Gastrointestinal system: Abdomen is nondistended, soft and nontender. No organomegaly or masses felt. Normal bowel sounds heard. Central nervous system: Alert and oriented. No focal neurological deficits. Extremities: Symmetric 5 x 5 power. Skin: No rashes, lesions Psychiatry: confused, Difficult to fully assess.   Data Reviewed: I have personally reviewed following labs and imaging studies  CBC:  Recent Labs Lab 03/26/16 1848 03/27/16 0112  WBC 9.6 11.5*  HGB 13.1 11.6*  HCT 44.1 38.1  MCV 100.5* 99.7  PLT 149* 656*   Basic Metabolic Panel:  Recent Labs Lab 03/26/16 1848 03/27/16 0112 03/27/16 0633 03/27/16 1130 03/27/16 1521  NA 168* 170* 170* 165* 167*  K 3.8 3.6  --   --   --   CL >130* >130*  --   --   --   CO2 26 23  --   --   --   GLUCOSE 347* 261*  --   --   --   BUN 96* 80*  --   --   --   CREATININE 1.69* 1.37*  --   --   --   CALCIUM 9.4 8.4*  --    --   --    GFR: Estimated Creatinine Clearance: 21.2 mL/min (by C-G formula based on SCr of 1.37 mg/dL (H)). Liver Function Tests:  Recent Labs Lab 03/26/16 1848 03/27/16 0112  AST 55* 45*  ALT 39 33  ALKPHOS 66 58  BILITOT 0.5 0.7  PROT 6.9 5.9*  ALBUMIN 3.2* 2.9*    Recent Labs Lab 03/26/16 1848  LIPASE 58*    Recent Labs Lab 03/26/16 1848  AMMONIA 25   Coagulation  Profile:  Recent Labs Lab 03/26/16 1848  INR 1.09   Cardiac Enzymes: No results for input(s): CKTOTAL, CKMB, CKMBINDEX, TROPONINI in the last 168 hours. BNP (last 3 results) No results for input(s): PROBNP in the last 8760 hours. HbA1C: No results for input(s): HGBA1C in the last 72 hours. CBG:  Recent Labs Lab 03/27/16 0752 03/27/16 1201 03/27/16 1602 03/27/16 1941 03/28/16 0736  GLUCAP 148* 126* 103* 135* 135*   Lipid Profile: No results for input(s): CHOL, HDL, LDLCALC, TRIG, CHOLHDL, LDLDIRECT in the last 72 hours. Thyroid Function Tests: No results for input(s): TSH, T4TOTAL, FREET4, T3FREE, THYROIDAB in the last 72 hours. Anemia Panel: No results for input(s): VITAMINB12, FOLATE, FERRITIN, TIBC, IRON, RETICCTPCT in the last 72 hours. Sepsis Labs:  Recent Labs Lab 03/26/16 1859 03/26/16 2135 03/27/16 0112  LATICACIDVEN 5.42* 4.09* 4.4*    Recent Results (from the past 240 hour(s))  Urine culture     Status: Abnormal (Preliminary result)   Collection Time: 03/26/16  7:00 PM  Result Value Ref Range Status   Specimen Description URINE, RANDOM  Final   Special Requests NONE  Final   Culture (A)  Final    >=100,000 COLONIES/mL ESCHERICHIA COLI CULTURE REINCUBATED FOR BETTER GROWTH Performed at [facility]    Report Status PENDING  Incomplete   Organism ID, Bacteria ESCHERICHIA COLI (A)  Final      Susceptibility   Escherichia coli - MIC*    AMPICILLIN <=2 SENSITIVE Sensitive     CEFAZOLIN <=4 SENSITIVE Sensitive     CEFTRIAXONE <=1 SENSITIVE Sensitive      CIPROFLOXACIN <=0.25 SENSITIVE Sensitive     GENTAMICIN <=1 SENSITIVE Sensitive     IMIPENEM <=0.25 SENSITIVE Sensitive     NITROFURANTOIN <=16 SENSITIVE Sensitive     TRIMETH/SULFA <=20 SENSITIVE Sensitive     AMPICILLIN/SULBACTAM <=2 SENSITIVE Sensitive     PIP/TAZO <=4 SENSITIVE Sensitive     Extended ESBL NEGATIVE Sensitive     * >=100,000 COLONIES/mL ESCHERICHIA COLI  Blood Culture (routine x 2)     Status: None (Preliminary result)   Collection Time: 03/26/16  7:27 PM  Result Value Ref Range Status   Specimen Description BLOOD RIGHT ANTECUBITAL  Final   Special Requests BOTTLES DRAWN AEROBIC ONLY 8ML  Final   Culture   Final    NO GROWTH 3 DAYS Performed at [facility]    Report Status PENDING  Incomplete  MRSA PCR Screening     Status: None   Collection Time: 03/26/16 11:10 PM  Result Value Ref Range Status   MRSA by PCR NEGATIVE NEGATIVE Final    Comment:        The GeneXpert MRSA Assay (FDA approved for NASAL specimens only), is one component of a comprehensive MRSA colonization surveillance program. It is not intended to diagnose MRSA infection nor to guide or monitor treatment for MRSA infections.   Blood Culture (routine x 2)     Status: None (Preliminary result)   Collection Time: 03/27/16  1:12 AM  Result Value Ref Range Status   Specimen Description BLOOD RIGHT HAND  Final   Special Requests IN PEDIATRIC BOTTLE 1ML  Final   Culture   Final    NO GROWTH 2 DAYS Performed at [facility]    Report Status PENDING  Incomplete     Radiology Studies: No results found.  Scheduled Meds: . [START ON 03/30/2016] cefTRIAXone (ROCEPHIN)  IV  1 g Intravenous Q24H  . chlorhexidine  15 mL Mouth Rinse BID  . fluconazole (DIFLUCAN) IV  50 mg Intravenous QHS  . mouth rinse  15 mL Mouth Rinse q12n4p  . pantoprazole (PROTONIX) IV  40 mg Intravenous Q12H  . sodium chloride flush  3 mL Intravenous Q12H   Continuous Infusions:    LOS: 3 days   Rosia Syme,  Orpah Melter, MD [facility] Pager [phone]  If 7PM-7AM, please contact night-coverage [email or web address] Password [email or web address] 03/29/2016, 3:26 PM

## 2016-03-29 NOTE — Progress Notes (Signed)
Pt position changed , partial bath /linen change performed  And oral care . Facial grimacing , tears and non verbal mouth movements noted when these activities performed . It appears the pt is in significant pain . Pt given morphine for pain relief . She is now resting . No signs of distress . Monitored pt at bedside . 3lnc in place .

## 2016-03-29 NOTE — Progress Notes (Signed)
Nutrition Brief Note  Pt identified with Low Braden Score.  Chart reviewed. Pt now transitioning to comfort care.  No nutrition interventions warranted at this time.  Please consult as needed.   Tilda FrancoLindsey Shuntay Everetts, MS, RD, LDN Pager: [phone] After Hours Pager: [phone]

## 2016-03-29 NOTE — Progress Notes (Addendum)
Pharmacy Antibiotic Note  Cathy LenisLeila Jennings is a 82 y.o. female admitted on 03/26/2016 with AMS. Found to have E.Coli UTI, hypernatremia, and AKI.  Pharmacy has been consulted for Ceftriaxone dosing.  Plan: Ceftriaxone 1g IV q24h  Dosage remains stable and need for further dosage adjustment appears unlikely at present.    Will sign off at this time.  Please reconsult if a change in clinical status warrants re-evaluation of dosage.  Haynes Hoehnolleen Keilon Ressel, PharmD, BCPS 03/29/2016, 11:55 AM  Pager: [phone]  Height: 5\' 2"  (157.5 cm) Weight: 110 lb 14.3 oz (50.3 kg) IBW/kg (Calculated) : 50.1  Temp (24hrs), Avg:98.6 F (37 C), Min:98.6 F (37 C), Max:98.6 F (37 C)   Recent Labs Lab 03/26/16 1848 03/26/16 1859 03/26/16 2135 03/27/16 0112  WBC 9.6  --   --  11.5*  CREATININE 1.69*  --   --  1.37*  LATICACIDVEN  --  5.42* 4.09* 4.4*    Estimated Creatinine Clearance: 21.2 mL/min (by C-G formula based on SCr of 1.37 mg/dL (H)).    No Known Allergies  Antimicrobials this admission: 12/8 Zosyn>> 12/9 12/8 vancomycin >> 12/9 12/8 Fluconazole (MD) >> 12/11 Ceftriaxone >>  Dose adjustments this admission:  Microbiology results: 12/11 BCx: NGTD 12/11 UCx: >100K E.Coli (pansensitive)  12/11 MRSA PCR: Negative  Thank you for allowing pharmacy to be a part of this patient's care.  Haynes Hoehnolleen Trent Theisen, PharmD, BCPS 03/29/2016, 11:56 AM  Pager: [phone]

## 2016-03-29 NOTE — Progress Notes (Signed)
Niece at bedside

## 2016-03-30 LAB — URINE CULTURE

## 2016-03-30 MED ORDER — DEXTROSE 5 % IV SOLN: 1.0000 g | INTRAVENOUS | Status: AC

## 2016-03-30 MED ORDER — ONDANSETRON HCL 4 MG/2ML IJ SOLN: 4.0000 mg | Freq: Four times a day (QID) | INTRAMUSCULAR | 0 refills | Status: AC | PRN

## 2016-03-30 MED ORDER — MORPHINE SULFATE (PF) 2 MG/ML IV SOLN: 1.0000 mg | INTRAVENOUS | 0 refills | Status: AC | PRN

## 2016-03-30 MED ORDER — LORAZEPAM 2 MG/ML IJ SOLN: 1.0000 mg | INTRAMUSCULAR | 0 refills | Status: AC | PRN

## 2016-03-30 MED ORDER — IPRATROPIUM-ALBUTEROL 0.5-2.5 (3) MG/3ML IN SOLN: 3.0000 mL | RESPIRATORY_TRACT | Status: AC | PRN

## 2016-03-30 NOTE — Care Management Important Message (Signed)
Important Message  Patient Details  Name: Baldemar LenisLeila Mchargue MRN: [id number] Date of Birth: 08/18/1922   Medicare Important Message Given:  Yes    Haskell FlirtJamison, Caedan Sumler 03/30/2016, 11:17 AMImportant Message  Patient Details  Name: Baldemar LenisLeila Parodi MRN: [id number] Date of Birth: 04/16/1923   Medicare Important Message Given:  Yes    Haskell FlirtJamison, Nazier Neyhart 03/30/2016, 11:17 AM

## 2016-03-30 NOTE — Progress Notes (Signed)
Patient left hospital at this time. [facility]. Foley intact. IV site inplace. Comfortable.D/c via non emergency ambulance.

## 2016-03-30 NOTE — Progress Notes (Signed)
[facility]. All questions answered appropriately.

## 2016-03-30 NOTE — NC FL2 (Signed)
[place] MEDICAID FL2 LEVEL OF CARE SCREENING TOOL     IDENTIFICATION  Patient Name: Cathy Jennings Birthdate: 02/27/1923 Sex: female Admission Date (Current Location): 03/26/2016  [facility] [place]   Facility and Address:  [facility],  [address], [place] [id number]      Provider Number: [id number]  Attending Physician Name and Address:  Jerald KiefStephen K Chiu, MD  Relative Name and Phone Number:       Current Level of Care: Hospital Recommended Level of Care: Skilled Nursing Facility Prior Approval Number:    Date Approved/Denied:   PASRR Number: [id number]  Discharge Plan: SNF    Current Diagnoses: Patient Active Problem List   Diagnosis Date Noted  . Infection of urinary tract 03/27/2016  . Acute respiratory failure with hypoxia (HCC) 03/27/2016  . Acute renal failure (HCC) 03/27/2016  . Diabetes mellitus type 2 in nonobese (HCC) 03/27/2016  . Hypothyroidism 03/27/2016  . Hypernatremia 03/26/2016  . Sepsis (HCC) 03/26/2016    Orientation RESPIRATION BLADDER Height & Weight      (responds to voice)  Normal Indwelling catheter Weight: 110 lb 14.3 oz (50.3 kg) Height:  5\' 2"  (157.5 cm)  BEHAVIORAL SYMPTOMS/MOOD NEUROLOGICAL BOWEL NUTRITION STATUS  Other (Comment) (no behaviors)   Continent Diet  AMBULATORY STATUS COMMUNICATION OF NEEDS Skin   Total Care Verbally Normal                       Personal Care Assistance Level of Assistance  Total care Bathing Assistance: Maximum assistance     Total Care Assistance: Maximum assistance   Functional Limitations Info  Sight, Hearing, Speech Sight Info: Adequate Hearing Info: Adequate Speech Info: Adequate    SPECIAL CARE FACTORS FREQUENCY                       Contractures Contractures Info: Not present    Additional Factors Info  Code Status (DNR)               Current Medications (03/30/2016):  This is the current hospital active medication  list Current Facility-Administered Medications  Medication Dose Route Frequency Provider Last Rate Last Dose  . acetaminophen (TYLENOL) tablet 650 mg  650 mg Oral Q6H PRN Clydie Braunondell A Smith, MD       Or  . acetaminophen (TYLENOL) suppository 650 mg  650 mg Rectal Q6H PRN Clydie Braunondell A Smith, MD      . cefTRIAXone (ROCEPHIN) 1 g in dextrose 5 % 50 mL IVPB  1 g Intravenous Q24H Jerald KiefStephen K Chiu, MD      . chlorhexidine (PERIDEX) 0.12 % solution 15 mL  15 mL Mouth Rinse BID Clydie Braunondell A Smith, MD   15 mL at 03/30/16 0930  . fluconazole (DIFLUCAN) IVPB 50 mg  50 mg Intravenous QHS Jerald KiefStephen K Chiu, MD   50 mg at 03/29/16 2144  . ipratropium-albuterol (DUONEB) 0.5-2.5 (3) MG/3ML nebulizer solution 3 mL  3 mL Nebulization Q4H PRN Rondell A Katrinka BlazingSmith, MD      . LORazepam (ATIVAN) injection 1 mg  1 mg Intravenous Q4H PRN Jerald KiefStephen K Chiu, MD      . MEDLINE mouth rinse  15 mL Mouth Rinse q12n4p Clydie Braunondell A Smith, MD   15 mL at 03/29/16 1727  . morphine 2 MG/ML injection 1 mg  1 mg Intravenous Q3H PRN Jerald KiefStephen K Chiu, MD   1 mg at 03/30/16 [id number]  . ondansetron (ZOFRAN) tablet 4 mg  4 mg Oral Q6H PRN Clydie Braunondell A Smith, MD       Or  . ondansetron (ZOFRAN) injection 4 mg  4 mg Intravenous Q6H PRN Rondell A Katrinka BlazingSmith, MD      . pantoprazole (PROTONIX) injection 40 mg  40 mg Intravenous Q12H Rondell Burtis JunesA Smith, MD   40 mg at 03/30/16 0930  . sodium chloride flush (NS) 0.9 % injection 3 mL  3 mL Intravenous Q12H Clydie Braunondell A Smith, MD   3 mL at 03/30/16 0930     Discharge Medications: Please see discharge summary for a list of discharge medications.  Relevant Imaging Results:  Relevant Lab Results:   Additional Information SS # [id number]  Makaiah Terwilliger, Dickey GaveJamie Lee, LCSW

## 2016-03-30 NOTE — Progress Notes (Signed)
CSW assisting with d/c planning. [facility] today for in pt residential hospice placement. Pt's daughter Renea Eevelyn contacted and is in agreement with this plan. D/C Summary sent to hospice for review. No scripts needed. [facility] transport required. Medical Necessity form completed. # for report provided to nsg.  Cori RazorJamie Eliaz Fout LCSW [phone]

## 2016-03-30 NOTE — Progress Notes (Signed)
Mouth care performed. Patient comfortable at this time.

## 2016-03-30 NOTE — Progress Notes (Signed)
[facility] is [facility] to offer [facility] today. Family is aware and has requested SNF placement [place]. SNF search has been initiated and bed offers pending. MD has been updated. CSW will continue to follow to assist with d/c planning needs.  Cori RazorJamie Josemiguel Gries LCSW [phone]

## 2016-03-30 NOTE — Discharge Summary (Signed)
Physician Discharge Summary  Cathy Jennings JYN:[id number] DOB: 04-02-1923 DOA: 03/26/2016  PCP: Sande Brothers, MD  Admit date: 03/26/2016 Discharge date: 03/30/2016  Admitted From: [facility] Disposition:  Residential hospice  Recommendations for Outpatient Follow-up:  1. Follow up per Hospice  CODE STATUS:DNR, comfort Diet recommendation: comfort   Brief/Interim Summary: 82 y.o.femalewith medical history significant ofHTN, dementia, diabetes mellitus type 2; who presented for being acutely altered. In the ED, patient noted to have sodium of 168 with Cr of 1.69. Patient was admitted for further work up.  Sepsis secondary to urinary tract infection:  - Sepsis criteria met with lactic acid, tachypnea, and positive urinalysis - Urine cx has grown >100,000 ecoli species - continued on rocephin for now for comfort (started on 12/11)  Hypernatremia secondary to severe dehydration:  -Labs revealing of severe hypernatremia that was difficult to improve with hypotonic IVF -Discussed with both patient granddaughter and daughter listed. Both are in agreement with full comfort and transition to hospice. -Given worsening hypernatremia and markedly poor PO intake, prognosis is less than 2 weeks.  - discussed with social work for placement to residential hospice. - Today, patient remains confused, is awake. Appears comfortable. Will benefit from continued morphine to treat pain/air hunger and ativan for agitation  - Plan to discharge to residential hospice  Acute metabolic encephalopathy/ dementia: -Likely combination of UTi, hypernatremia, worsening dementia  Acute respiratory distress with hypoxia:  -Comfort measures now -Cont O2 for comfort only -cont PRN morphine for air hunger  Suspected thrush:  -Noted on the anterior and posterior mouth. Thought to be related to this significant pain malnutrition -patient is continued on diflucan - Stable  Acute renal failure:  -Baseline  creatinine 0.78was just one month ago, but patient presents with creatinine elevated to 1.69 and BUN 96. -LIkley secondary to marked dehydration -Now full comfort -cont indwelling foley cath for comfort  Diabetes mellitus type 2 with hyperglycemia -full comfort -hold further finger sticks -appears stable  Atrial fibrillation:  -Comfort care -off tele  Hypothyroidism -Now full comfort -d/c thyroid replacement  Discharge Diagnoses:  Principal Problem:   Sepsis ([place]) Active Problems:   Hypernatremia   Infection of urinary tract   Acute respiratory failure with hypoxia (HCC)   Acute renal failure (HCC)   Diabetes mellitus type 2 in nonobese [facility])   Hypothyroidism    Discharge Instructions     Medication List    STOP taking these medications   alendronate 70 MG tablet Commonly known as:  FOSAMAX   atorvastatin 10 MG tablet Commonly known as:  LIPITOR   CALCIUM 600+D3 600-400 MG-UNIT Tabs Generic drug:  Calcium Carbonate-Vitamin D3   diclofenac sodium 1 % Gel Commonly known as:  VOLTAREN   divalproex 125 MG capsule Commonly known as:  DEPAKOTE SPRINKLE   gabapentin 100 MG capsule Commonly known as:  NEURONTIN   GENERLAC 10 GM/15ML Soln Generic drug:  lactulose (encephalopathy)   levothyroxine 88 MCG tablet Commonly known as:  SYNTHROID, LEVOTHROID   loperamide 2 MG capsule Commonly known as:  IMODIUM   LORazepam 0.5 MG tablet Commonly known as:  ATIVAN Replaced by:  LORazepam 2 MG/ML injection   magnesium hydroxide 400 MG/5ML suspension Commonly known as:  MILK OF MAGNESIA   Melatonin 1 MG Tabs   MINTOX 200-200-20 MG/5ML suspension Generic drug:  alum & mag hydroxide-simeth   neomycin-bacitracin-polymyxin ointment Commonly known as:  NEOSPORIN   OLANZapine 2.5 MG tablet Commonly known as:  ZYPREXA   PRESCRIPTION MEDICATION   ROBAFEN 100  MG/5ML syrup Generic drug:  guaifenesin   senna-docusate 8.6-50 MG tablet Commonly known as:   Senokot-S   traZODone 50 MG tablet Commonly known as:  DESYREL   vitamin C 500 MG tablet Commonly known as:  ASCORBIC ACID     TAKE these medications   acetaminophen 325 MG tablet Commonly known as:  TYLENOL Take 650 mg by mouth 3 (three) times daily.   acetaminophen 500 MG tablet Commonly known as:  TYLENOL Take 500 mg by mouth every 4 (four) hours as needed for mild pain, moderate pain, fever or headache.   cefTRIAXone 1 g in dextrose 5 % 50 mL Inject 1 g into the vein daily. Start taking on:  03/31/2016   ipratropium-albuterol 0.5-2.5 (3) MG/3ML Soln Commonly known as:  DUONEB Take 3 mLs by nebulization every 4 (four) hours as needed.   LORazepam 2 MG/ML injection Commonly known as:  ATIVAN Inject 0.5 mLs (1 mg total) into the vein every 4 (four) hours as needed for anxiety. Replaces:  LORazepam 0.5 MG tablet   morphine 2 MG/ML injection Inject 0.5 mLs (1 mg total) into the vein every 3 (three) hours as needed (or air hunger).   ondansetron 4 MG/2ML Soln injection Commonly known as:  ZOFRAN Inject 2 mLs (4 mg total) into the vein every 6 (six) hours as needed for nausea.      Contact information for after-discharge care    Destination    [facility] Follow up.   Specialty:  [place] Contact information: [address] [place] [phone]             No Known Allergies   Procedures/Studies: Ct Head Wo Contrast  Result Date: 03/26/2016 CLINICAL DATA:  Altered mental status EXAM: CT HEAD WITHOUT CONTRAST TECHNIQUE: Contiguous axial images were obtained from the base of the skull through the vertex without intravenous contrast. COMPARISON:  February 26, 2016 FINDINGS: Brain: No subdural, epidural, or subarachnoid hemorrhage. The cerebellum and brainstem are unchanged. Basal cisterns are patent. Severe white matter changes are stable. No acute cortical ischemia or infarct. The ventricles and  sulci are prominent but stable. No mass, mass effect, or midline shift. Vascular: Calcified atherosclerosis in the intracranial carotid arteries. Skull: Normal. Negative for fracture or focal lesion. Sinuses/Orbits: No acute finding. Other: None. IMPRESSION: 1. Chronic white matter changes.  No acute intracranial abnormality. Electronically Signed   By: Dorise Bullion III M.D   On: 03/26/2016 18:35   Dg Chest Portable 1 View  Result Date: 03/26/2016 CLINICAL DATA:  Fall EXAM: PORTABLE CHEST 1 VIEW COMPARISON:  02/26/2016 FINDINGS: Normal heart size. Stable mediastinal contours when allowing for distortion by rightward rotation. There is no edema, consolidation, effusion, or pneumothorax. Advanced spondylosis. Severe bilateral glenohumeral osteoarthritis and chronic rotator cuff tears. IMPRESSION: No evidence of acute disease. Electronically Signed   By: Monte Fantasia M.D.   On: 03/26/2016 18:39    Subjective: Unable to obtain from patient  Discharge Exam: Vitals:   03/30/16 0615 03/30/16 1306  BP: 105/61 107/60  Pulse: 70 89  Resp: (!) 21 20  Temp: 99.8 F (37.7 C) 99.7 F (37.6 C)   Vitals:   03/29/16 0520 03/29/16 2204 03/30/16 0615 03/30/16 1306  BP: (!) 99/48 (!) 101/55 105/61 107/60  Pulse: 64 65 70 89  Resp: (!) 22 (!) 21 (!) 21 20  Temp: 98.6 F (37 C) 98.8 F (37.1 C) 99.8 F (37.7 C) 99.7 F (37.6 C)  TempSrc: Oral Axillary Axillary Axillary  SpO2: 100% 94% 96% 100%  Weight:      Height:        General: Pt awake, not alert, not in acute distress Cardiovascular: RRR, S1/S2 +, no rubs, no gallops Respiratory: shallow breaths, no wheezing, no rhonchi Abdominal: Soft, NT, ND, bowel sounds + Extremities: no edema, no cyanosis   The results of significant diagnostics from this hospitalization (including imaging, microbiology, ancillary and laboratory) are listed below for reference.     Microbiology: Recent Results (from the past 240 hour(s))  Urine culture      Status: Abnormal   Collection Time: 03/26/16  7:00 PM  Result Value Ref Range Status   Specimen Description URINE, RANDOM  Final   Special Requests NONE  Final   Culture (A)  Final    >=100,000 COLONIES/mL ESCHERICHIA COLI >=100,000 COLONIES/mL AEROCOCCUS URINAE Standardized susceptibility testing for this organism is not available. Performed at [facility]    Report Status 03/30/2016 FINAL  Final   Organism ID, Bacteria ESCHERICHIA COLI (A)  Final      Susceptibility   Escherichia coli - MIC*    AMPICILLIN <=2 SENSITIVE Sensitive     CEFAZOLIN <=4 SENSITIVE Sensitive     CEFTRIAXONE <=1 SENSITIVE Sensitive     CIPROFLOXACIN <=0.25 SENSITIVE Sensitive     GENTAMICIN <=1 SENSITIVE Sensitive     IMIPENEM <=0.25 SENSITIVE Sensitive     NITROFURANTOIN <=16 SENSITIVE Sensitive     TRIMETH/SULFA <=20 SENSITIVE Sensitive     AMPICILLIN/SULBACTAM <=2 SENSITIVE Sensitive     PIP/TAZO <=4 SENSITIVE Sensitive     Extended ESBL NEGATIVE Sensitive     * >=100,000 COLONIES/mL ESCHERICHIA COLI  Blood Culture (routine x 2)     Status: None (Preliminary result)   Collection Time: 03/26/16  7:27 PM  Result Value Ref Range Status   Specimen Description BLOOD RIGHT ANTECUBITAL  Final   Special Requests BOTTLES DRAWN AEROBIC ONLY 8ML  Final   Culture   Final    NO GROWTH 4 DAYS Performed at [facility]    Report Status PENDING  Incomplete  MRSA PCR Screening     Status: None   Collection Time: 03/26/16 11:10 PM  Result Value Ref Range Status   MRSA by PCR NEGATIVE NEGATIVE Final    Comment:        The GeneXpert MRSA Assay (FDA approved for NASAL specimens only), is one component of a comprehensive MRSA colonization surveillance program. It is not intended to diagnose MRSA infection nor to guide or monitor treatment for MRSA infections.   Blood Culture (routine x 2)     Status: None (Preliminary result)   Collection Time: 03/27/16  1:12 AM  Result Value Ref Range  Status   Specimen Description BLOOD RIGHT HAND  Final   Special Requests IN PEDIATRIC BOTTLE 1ML  Final   Culture   Final    NO GROWTH 3 DAYS Performed at [facility]    Report Status PENDING  Incomplete     Labs: BNP (last 3 results) No results for input(s): BNP in the last 8760 hours. Basic Metabolic Panel:  Recent Labs Lab 03/26/16 1848 03/27/16 0112 03/27/16 0633 03/27/16 1130 03/27/16 1521  NA 168* 170* 170* 165* 167*  K 3.8 3.6  --   --   --   CL >130* >130*  --   --   --   CO2 26 23  --   --   --  GLUCOSE 347* 261*  --   --   --   BUN 96* 80*  --   --   --   CREATININE 1.69* 1.37*  --   --   --   CALCIUM 9.4 8.4*  --   --   --    Liver Function Tests:  Recent Labs Lab 03/26/16 1848 03/27/16 0112  AST 55* 45*  ALT 39 33  ALKPHOS 66 58  BILITOT 0.5 0.7  PROT 6.9 5.9*  ALBUMIN 3.2* 2.9*    Recent Labs Lab 03/26/16 1848  LIPASE 58*    Recent Labs Lab 03/26/16 1848  AMMONIA 25   CBC:  Recent Labs Lab 03/26/16 1848 03/27/16 0112  WBC 9.6 11.5*  HGB 13.1 11.6*  HCT 44.1 38.1  MCV 100.5* 99.7  PLT 149* 128*   Cardiac Enzymes: No results for input(s): CKTOTAL, CKMB, CKMBINDEX, TROPONINI in the last 168 hours. BNP: Invalid input(s): POCBNP CBG:  Recent Labs Lab 03/27/16 0752 03/27/16 1201 03/27/16 1602 03/27/16 1941 03/28/16 0736  GLUCAP 148* 126* 103* 135* 135*   D-Dimer No results for input(s): DDIMER in the last 72 hours. Hgb A1c No results for input(s): HGBA1C in the last 72 hours. Lipid Profile No results for input(s): CHOL, HDL, LDLCALC, TRIG, CHOLHDL, LDLDIRECT in the last 72 hours. Thyroid function studies No results for input(s): TSH, T4TOTAL, T3FREE, THYROIDAB in the last 72 hours.  Invalid input(s): FREET3 Anemia work up No results for input(s): VITAMINB12, FOLATE, FERRITIN, TIBC, IRON, RETICCTPCT in the last 72 hours. Urinalysis    Component Value Date/Time   COLORURINE YELLOW 03/26/2016 1900    APPEARANCEUR CLOUDY (A) 03/26/2016 1900   LABSPEC >1.030 (H) 03/26/2016 1900   PHURINE 5.0 03/26/2016 1900   GLUCOSEU NEGATIVE 03/26/2016 1900   HGBUR MODERATE (A) 03/26/2016 1900   BILIRUBINUR SMALL (A) 03/26/2016 1900   KETONESUR NEGATIVE 03/26/2016 1900   PROTEINUR 100 (A) 03/26/2016 1900   NITRITE POSITIVE (A) 03/26/2016 1900   LEUKOCYTESUR MODERATE (A) 03/26/2016 1900   Sepsis Labs Invalid input(s): PROCALCITONIN,  WBC,  LACTICIDVEN Microbiology Recent Results (from the past 240 hour(s))  Urine culture     Status: Abnormal   Collection Time: 03/26/16  7:00 PM  Result Value Ref Range Status   Specimen Description URINE, RANDOM  Final   Special Requests NONE  Final   Culture (A)  Final    >=100,000 COLONIES/mL ESCHERICHIA COLI >=100,000 COLONIES/mL AEROCOCCUS URINAE Standardized susceptibility testing for this organism is not available. Performed at [facility]    Report Status 03/30/2016 FINAL  Final   Organism ID, Bacteria ESCHERICHIA COLI (A)  Final      Susceptibility   Escherichia coli - MIC*    AMPICILLIN <=2 SENSITIVE Sensitive     CEFAZOLIN <=4 SENSITIVE Sensitive     CEFTRIAXONE <=1 SENSITIVE Sensitive     CIPROFLOXACIN <=0.25 SENSITIVE Sensitive     GENTAMICIN <=1 SENSITIVE Sensitive     IMIPENEM <=0.25 SENSITIVE Sensitive     NITROFURANTOIN <=16 SENSITIVE Sensitive     TRIMETH/SULFA <=20 SENSITIVE Sensitive     AMPICILLIN/SULBACTAM <=2 SENSITIVE Sensitive     PIP/TAZO <=4 SENSITIVE Sensitive     Extended ESBL NEGATIVE Sensitive     * >=100,000 COLONIES/mL ESCHERICHIA COLI  Blood Culture (routine x 2)     Status: None (Preliminary result)   Collection Time: 03/26/16  7:27 PM  Result Value Ref Range Status   Specimen Description BLOOD RIGHT ANTECUBITAL  Final   Special  Requests BOTTLES DRAWN AEROBIC ONLY 8ML  Final   Culture   Final    NO GROWTH 4 DAYS Performed at [facility]    Report Status PENDING  Incomplete  MRSA PCR Screening      Status: None   Collection Time: 03/26/16 11:10 PM  Result Value Ref Range Status   MRSA by PCR NEGATIVE NEGATIVE Final    Comment:        The GeneXpert MRSA Assay (FDA approved for NASAL specimens only), is one component of a comprehensive MRSA colonization surveillance program. It is not intended to diagnose MRSA infection nor to guide or monitor treatment for MRSA infections.   Blood Culture (routine x 2)     Status: None (Preliminary result)   Collection Time: 03/27/16  1:12 AM  Result Value Ref Range Status   Specimen Description BLOOD RIGHT HAND  Final   Special Requests IN PEDIATRIC BOTTLE 1ML  Final   Culture   Final    NO GROWTH 3 DAYS Performed at [facility]    Report Status PENDING  Incomplete     SIGNED:   Donne Hazel, MD  [facility] 03/30/2016, 3:08 PM  If 7PM-7AM, please contact night-coverage [email or web address] Password [email or web address]

## 2016-03-30 NOTE — Progress Notes (Signed)
Patient turned and repositioned at this time. Also mouth care provided.Will continue to monitor.

## 2016-03-31 LAB — CULTURE, BLOOD (ROUTINE X 2): Culture: NO GROWTH

## 2016-04-01 LAB — CULTURE, BLOOD (ROUTINE X 2): CULTURE: NO GROWTH

## 2016-04-19 DEATH — deceased

## 2017-10-21 IMAGING — CR DG LUMBAR SPINE COMPLETE 4+V
5 series · 5 of 5 positions shown · non-contrast
Comparison: None.

CLINICAL DATA: Unwitnessed fall.

EXAM:
LUMBAR SPINE - COMPLETE 4+ VIEW

[t lumbar spine ap]
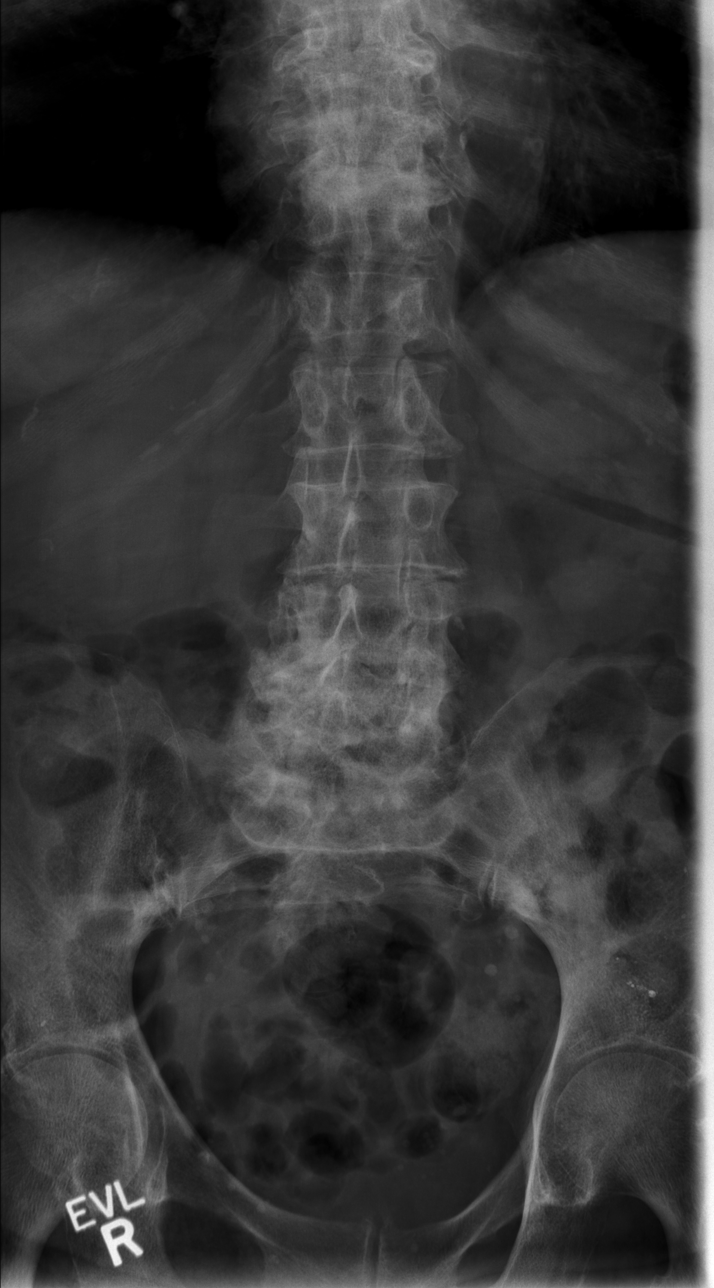

[t lumbar spine obl (1 of 2)]
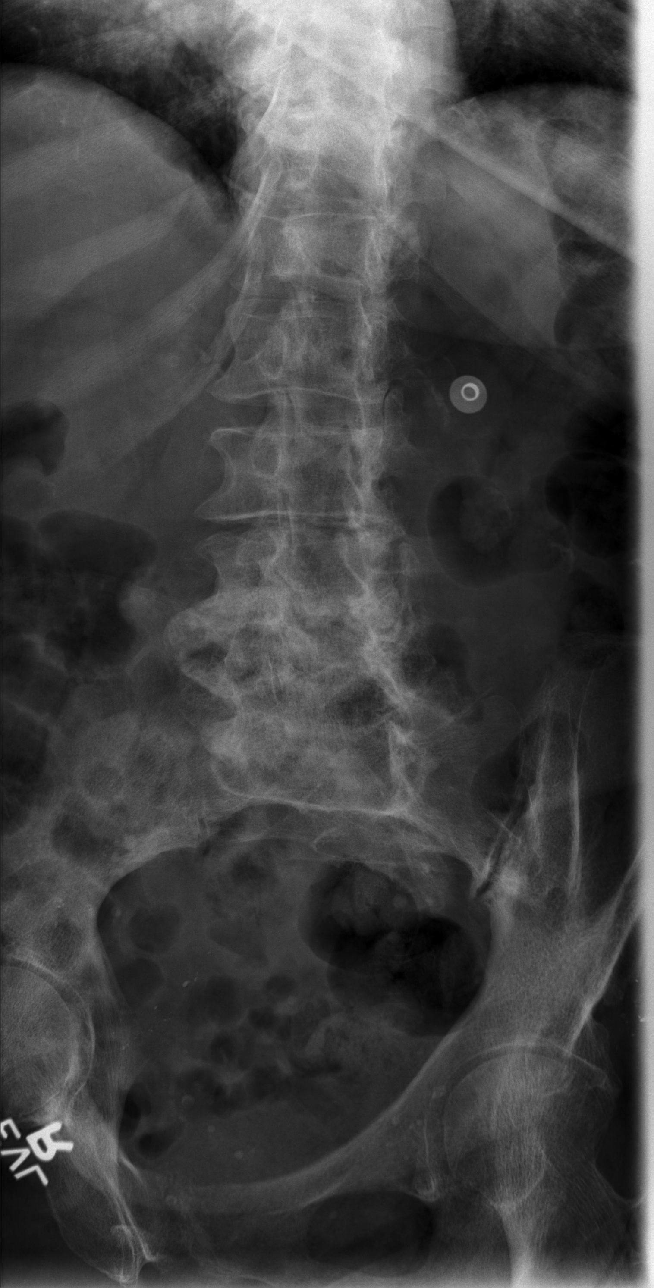

[t lumbar spine obl (2 of 2)]
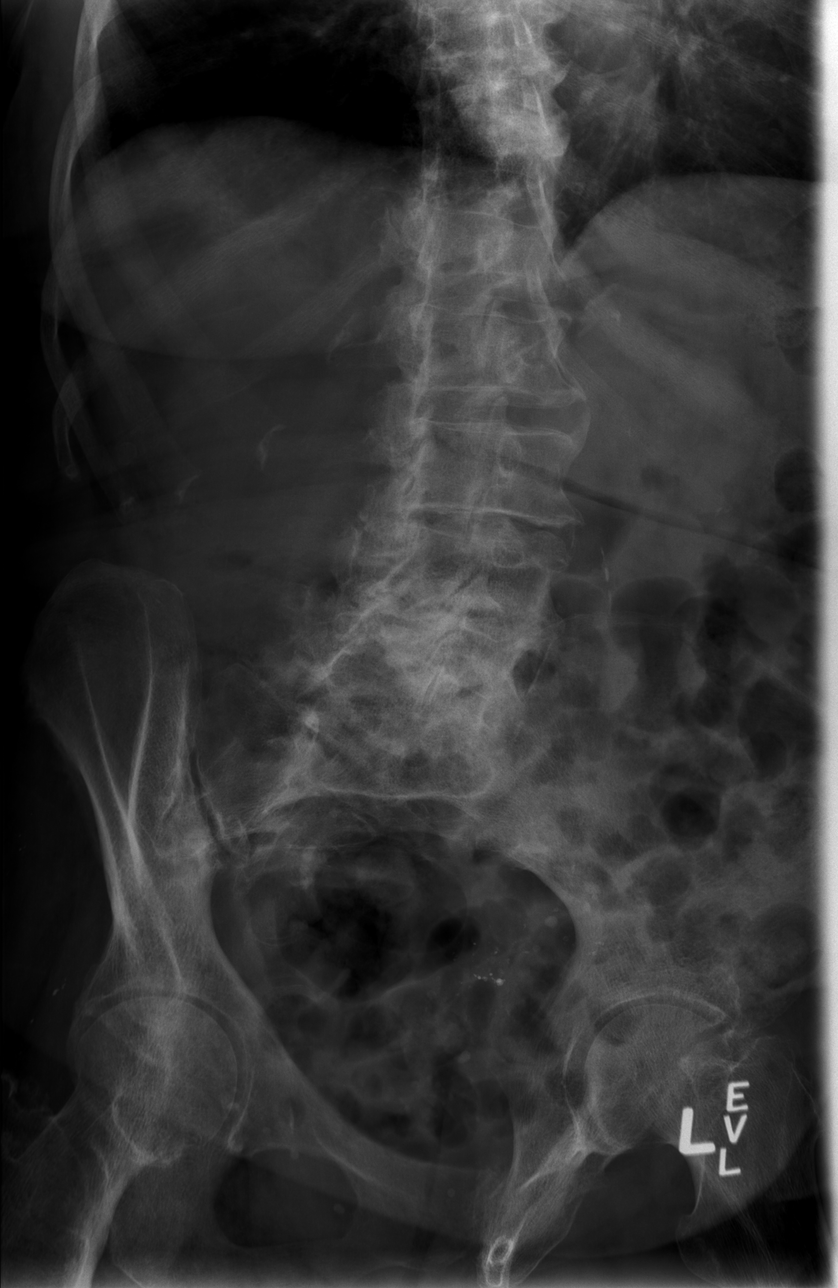

[t lumbar spine lat]
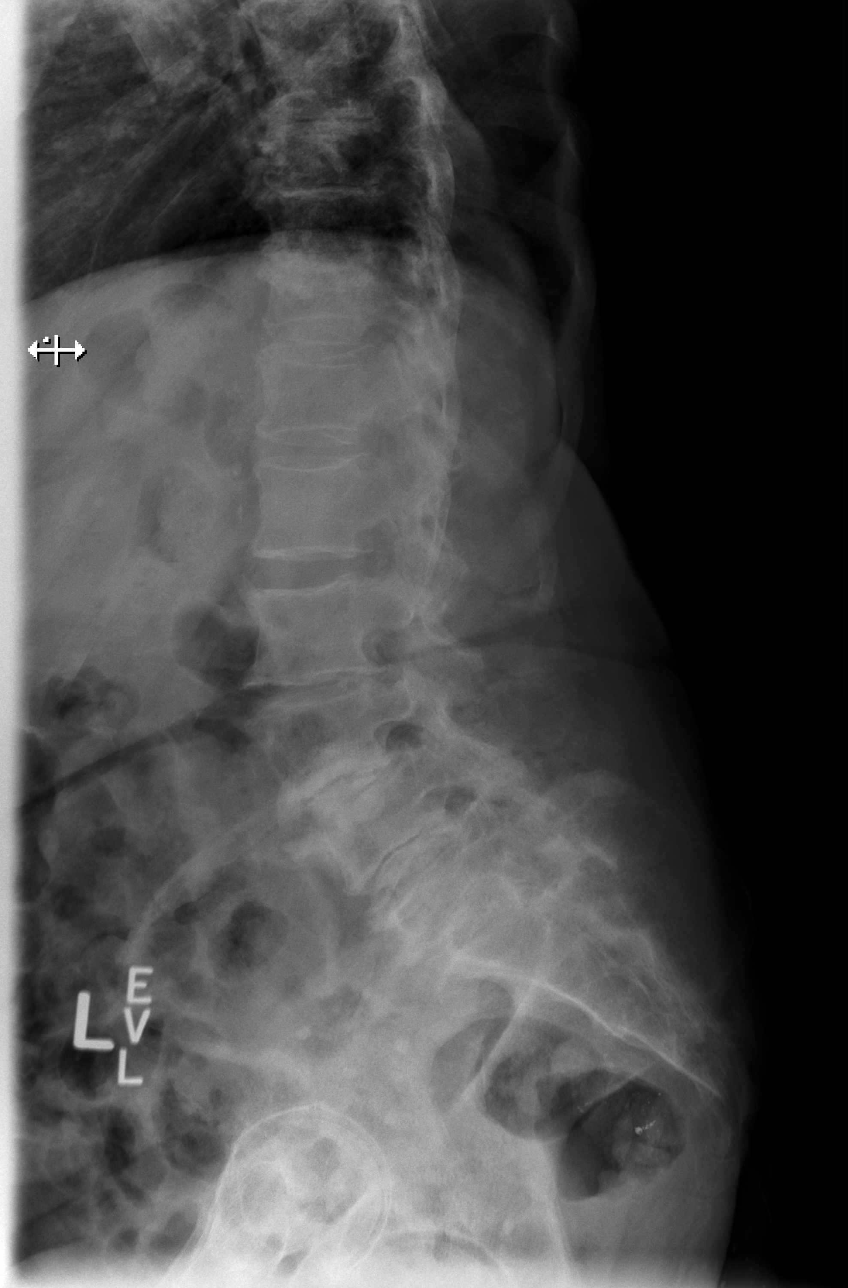

[t lumbar l-5 s-1 spot]
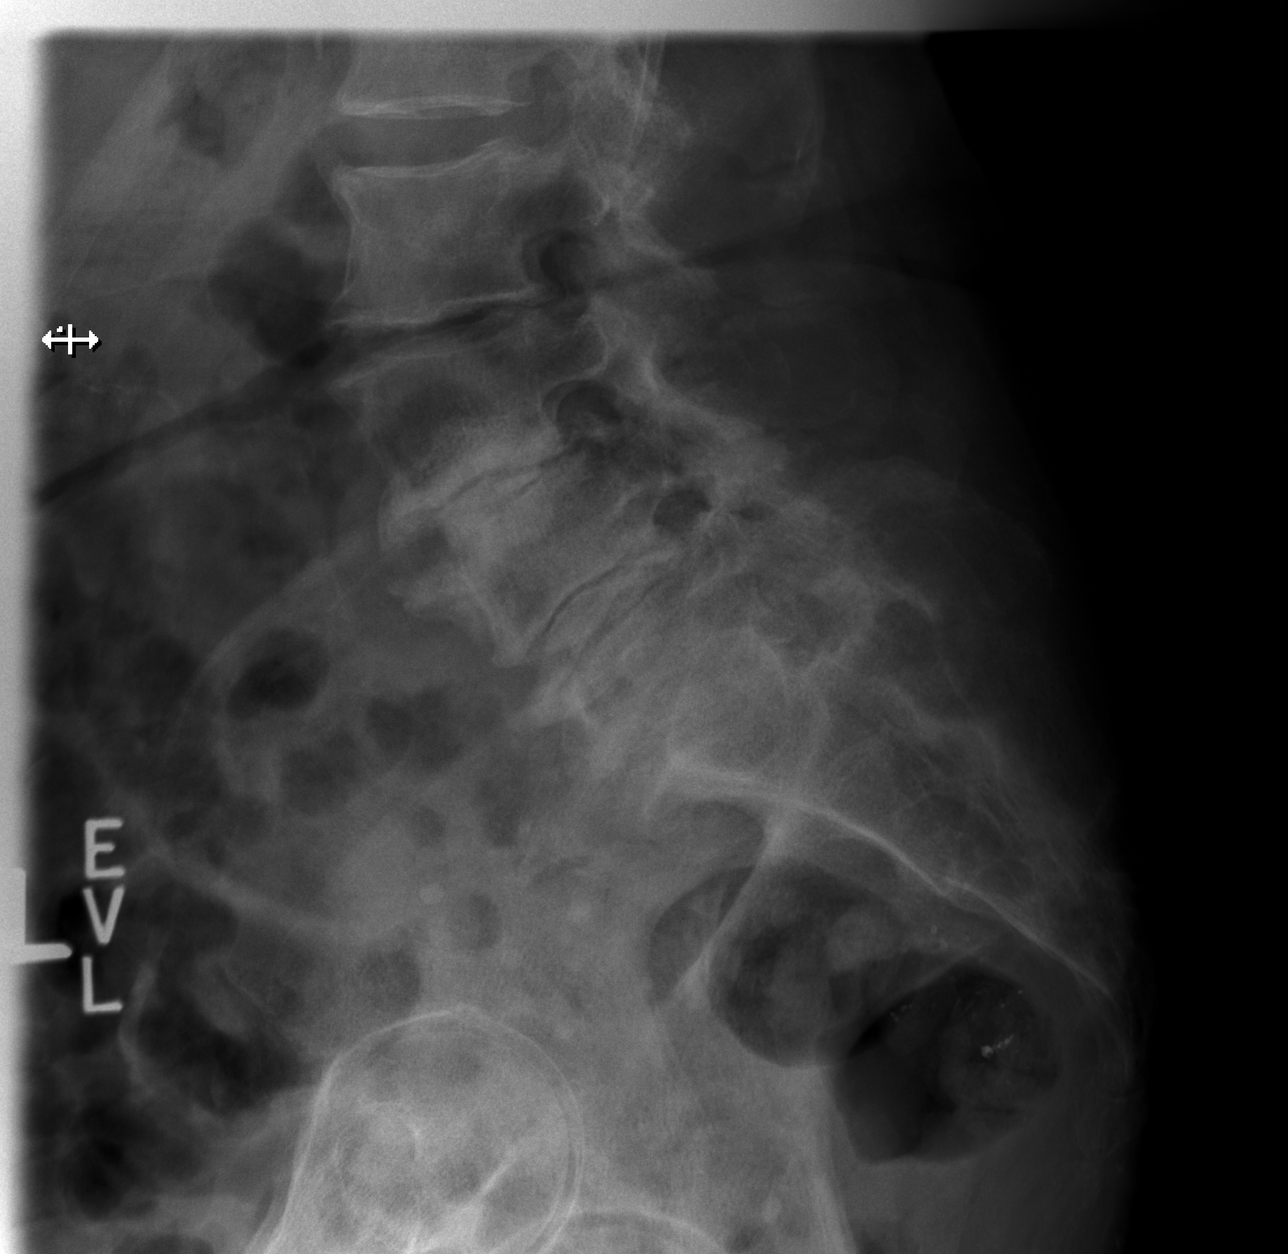

[5 of 5 positions shown; findings below may reference images not displayed]

FINDINGS: There is no evidence of lumbar spine fracture. There are extensive
osteoarthritic changes throughout the lumbosacral spine,
particularly worse at L3-L4, L4-L5 and L5-S1, with disc space
narrowing, endplate sclerosis, remodeling of vertebral bodies and
osteophyte formation. Posterior facet arthropathy, moderate in
severity is also seen in the lower lumbosacral spine. No alignment
abnormalities are seen.
IMPRESSION: No acute fracture or dislocation identified about the lumbosacral
spine.

Moderate to advanced osteoarthritic changes, particularly prominent
in the lower lumbosacral spine.

## 2017-12-26 IMAGING — CR DG CHEST 1V PORT
1 series · 1 of 1 positions shown · non-contrast
Comparison: 02/26/2016

CLINICAL DATA: Fall

EXAM:
PORTABLE CHEST 1 VIEW

[x chest ap]
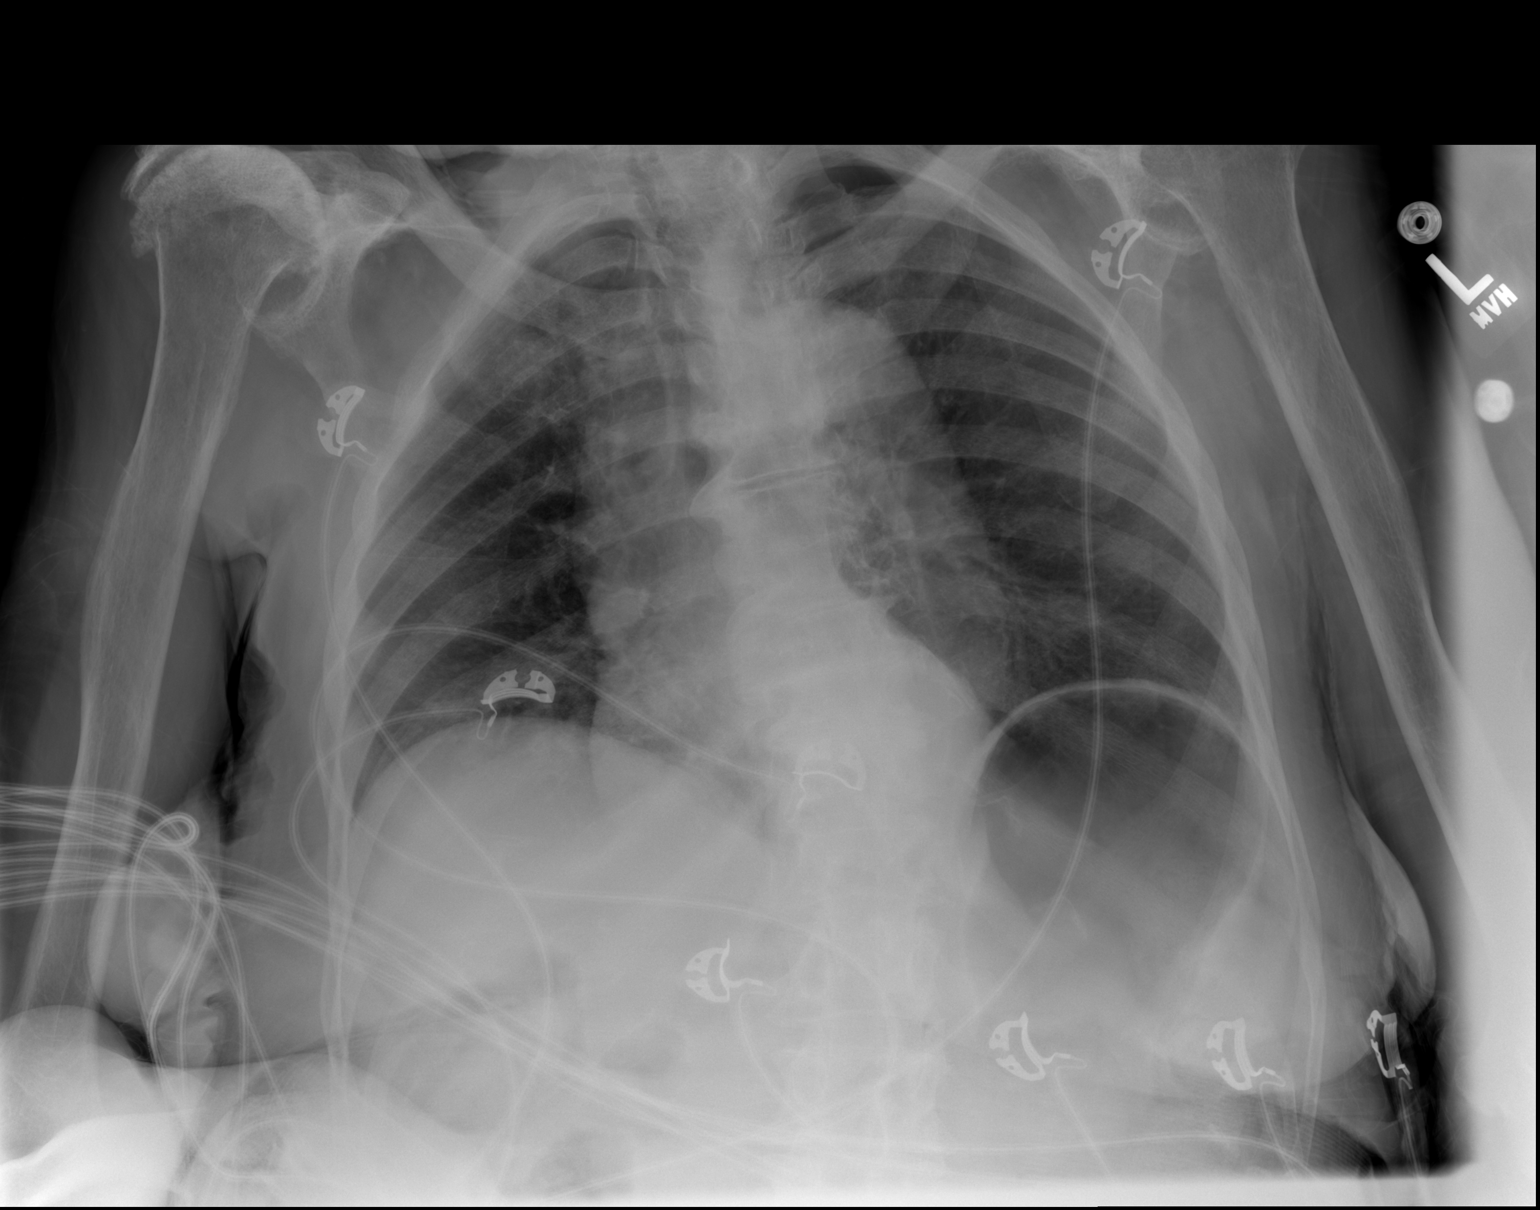

[1 of 1 positions shown; findings below may reference images not displayed]

FINDINGS: Normal heart size. Stable mediastinal contours when allowing for
distortion by rightward rotation. There is no edema, consolidation,
effusion, or pneumothorax.

Advanced spondylosis. Severe bilateral glenohumeral osteoarthritis
and chronic rotator cuff tears.
IMPRESSION: No evidence of acute disease.
